# Patient Record
Sex: Female | Born: 1945 | Race: White | Hispanic: No | Marital: Married | Smoking: Never smoker
Health system: Southern US, Community
[De-identification: ages and names within clinical notes are randomized; demographics above are authoritative.]

## PROBLEM LIST (undated history)

## (undated) DIAGNOSIS — M1990 Unspecified osteoarthritis, unspecified site: Secondary | ICD-10-CM

## (undated) DIAGNOSIS — R7303 Prediabetes: Secondary | ICD-10-CM

## (undated) DIAGNOSIS — E119 Type 2 diabetes mellitus without complications: Secondary | ICD-10-CM

## (undated) DIAGNOSIS — I10 Essential (primary) hypertension: Secondary | ICD-10-CM

## (undated) HISTORY — DX: Essential (primary) hypertension: I10

## (undated) HISTORY — DX: Prediabetes: R73.03

## (undated) HISTORY — PX: BREAST BIOPSY: SHX20

## (undated) HISTORY — PX: EYE SURGERY: SHX253

## (undated) HISTORY — DX: Unspecified osteoarthritis, unspecified site: M19.90

## (undated) HISTORY — DX: Type 2 diabetes mellitus without complications: E11.9

---

## 2016-10-02 DIAGNOSIS — I10 Essential (primary) hypertension: Secondary | ICD-10-CM | POA: Insufficient documentation

## 2016-10-02 DIAGNOSIS — E782 Mixed hyperlipidemia: Secondary | ICD-10-CM | POA: Insufficient documentation

## 2017-04-01 HISTORY — PX: WRIST ARTHROSCOPY: SUR100

## 2020-02-25 LAB — HM COLONOSCOPY

## 2020-10-02 LAB — MICROALBUMIN, URINE: Microalb, Ur: 95.8

## 2020-10-02 LAB — MICROALBUMIN / CREATININE URINE RATIO: Microalb Creat Ratio: 505

## 2021-07-04 ENCOUNTER — Ambulatory Visit (INDEPENDENT_AMBULATORY_CARE_PROVIDER_SITE_OTHER): Payer: Medicare Other | Admitting: Internal Medicine

## 2021-07-04 ENCOUNTER — Other Ambulatory Visit: Payer: Self-pay

## 2021-07-04 ENCOUNTER — Encounter: Payer: Self-pay | Admitting: Internal Medicine

## 2021-07-04 VITALS — BP 144/85 | HR 97 | Ht 68.0 in | Wt 162.0 lb

## 2021-07-04 DIAGNOSIS — I10 Essential (primary) hypertension: Secondary | ICD-10-CM

## 2021-07-04 DIAGNOSIS — E118 Type 2 diabetes mellitus with unspecified complications: Secondary | ICD-10-CM | POA: Insufficient documentation

## 2021-07-04 DIAGNOSIS — M19041 Primary osteoarthritis, right hand: Secondary | ICD-10-CM

## 2021-07-04 DIAGNOSIS — Z1159 Encounter for screening for other viral diseases: Secondary | ICD-10-CM | POA: Diagnosis not present

## 2021-07-04 DIAGNOSIS — R7303 Prediabetes: Secondary | ICD-10-CM

## 2021-07-04 DIAGNOSIS — E2839 Other primary ovarian failure: Secondary | ICD-10-CM | POA: Insufficient documentation

## 2021-07-04 DIAGNOSIS — I8393 Asymptomatic varicose veins of bilateral lower extremities: Secondary | ICD-10-CM

## 2021-07-04 DIAGNOSIS — Z1231 Encounter for screening mammogram for malignant neoplasm of breast: Secondary | ICD-10-CM | POA: Diagnosis not present

## 2021-07-04 DIAGNOSIS — M19042 Primary osteoarthritis, left hand: Secondary | ICD-10-CM

## 2021-07-04 MED ORDER — AMLODIPINE BESYLATE 5 MG PO TABS: 5.0000 mg | ORAL_TABLET | Freq: Every day | ORAL | 1 refills | Status: DC

## 2021-07-04 NOTE — Progress Notes (Signed)
° ° °Date:  07/04/2021  ° °Name:  Jenna Sheppard   DOB:  06/01/1946   MRN:  [id number] ° °New patient moved here from [place].  Son lives nearby in [place]. ° °Chief Complaint: New Patient (Initial Visit) and Hypertension ° °Hypertension °This is a chronic problem. The problem is controlled (115/70 at home). Pertinent negatives include no chest pain, headaches, palpitations or shortness of breath. Past treatments include ACE inhibitors and calcium channel blockers. The current treatment provides significant improvement. There is no history of kidney disease, CAD/MI or CVA.  °Diabetes °She presents for her follow-up diabetic visit. Diabetes type: prediabetes. Her disease course has been stable. Pertinent negatives for hypoglycemia include no dizziness, headaches or nervousness/anxiousness. Pertinent negatives for diabetes include no chest pain and no fatigue. Pertinent negatives for diabetic complications include no CVA.  ° °No results found for: NA, K, CO2, GLUCOSE, BUN, CREATININE, CALCIUM, EGFR, GFRNONAA, GLUCOSE °No results found for: CHOL, HDL, LDLCALC, LDLDIRECT, TRIG, CHOLHDL °No results found for: TSH °No results found for: HGBA1C °No results found for: WBC, HGB, HCT, MCV, PLT °No results found for: ALT, AST, GGT, ALKPHOS, BILITOT °No results found for: 25OHVITD2, 25OHVITD3, VD25OH  ° °Review of Systems  °Constitutional:  Negative for chills, fatigue and fever.  °Eyes:  Negative for visual disturbance.  °Respiratory:  Negative for cough, chest tightness and shortness of breath.   °Cardiovascular:  Positive for leg swelling (varicose veins). Negative for chest pain and palpitations.  °Gastrointestinal:  Negative for abdominal pain.  °Genitourinary:  Negative for dysuria and hematuria.  °Musculoskeletal:  Positive for arthralgias and joint swelling. Negative for gait problem.  °Neurological:  Negative for dizziness, light-headedness and headaches.  °Psychiatric/Behavioral:  Negative for dysphoric mood and sleep  disturbance. The patient is not nervous/anxious.   ° °Patient Active Problem List  ° Diagnosis Date Noted  ° 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  ° Ovarian failure 07/04/2021  ° Prediabetes 07/04/2021  ° Essential hypertension 10/02/2016  ° Mixed hyperlipidemia 10/02/2016  ° Osteoarthritis 10/02/2016  ° ° °Allergies  °Allergen Reactions  ° Red Dye Rash  ° Sulfa Antibiotics Nausea Only  ° ° °Past Surgical History:  °Procedure Laterality Date  ° ABDOMINAL HYSTERECTOMY  01/21/2005  ° Still has 1 ovary  ° ABLATION ON ENDOMETRIOSIS  01/08/1990  ° BREAST SURGERY  02/16/1985  ° 2nd surgery - 04/29/1996  ° CHOLECYSTECTOMY  07/21/2000  ° DILATION AND CURETTAGE OF UTERUS  02/18/1989  ° laser eye  02/09/2010  ° WRIST ARTHROSCOPY Left 04/01/2017  ° ° °Social History  ° °Tobacco Use  ° Smoking status: Never  ° Smokeless tobacco: Never  °Substance Use Topics  ° Alcohol use: Yes  °  Comment: RARE  ° Drug use: Never  ° ° ° °Medication list has been reviewed and updated. ° °Current Meds  °Medication Sig  ° co-enzyme Q-10 30 MG capsule Take 30 mg by mouth 3 (three) times daily.  ° diclofenac (VOLTAREN) 75 MG EC tablet Take 75 mg by mouth as needed.  ° Krill Oil 1000 MG CAPS Take by mouth.  ° lisinopril (ZESTRIL) 40 MG tablet Take 40 mg by mouth daily.  ° Multiple Vitamins-Minerals (MULTIVITAMIN WITH MINERALS) tablet Take 1 tablet by mouth daily.  ° [DISCONTINUED] amLODipine (NORVASC) 5 MG tablet Take 5-10 mg by mouth daily.  ° [DISCONTINUED] estradiol (ESTRING) 2 MG vaginal ring Place 2 mg vaginally. 3 times weekly.  ° ° °PHQ 2/9 Scores 07/04/2021  °PHQ - 2 Score 0  °  PHQ- 9 Score 0    GAD 7 : Generalized Anxiety Score 07/04/2021  Nervous, Anxious, on Edge 1  Control/stop worrying 1  Worry too much - different things 1  Trouble relaxing 0  Restless 0  Easily annoyed or irritable 3  Afraid - awful might happen 0  Total GAD 7 Score 6    BP Readings from Last 3 Encounters:  07/04/21 (!) 144/85    Physical  Exam Vitals and nursing note reviewed.  Constitutional:      General: She is not in acute distress.    Appearance: She is well-developed.  HENT:     Head: Normocephalic and atraumatic.  Neck:     Vascular: No carotid bruit.  Cardiovascular:     Rate and Rhythm: Normal rate and regular rhythm.     Pulses: Normal pulses.     Heart sounds: Normal heart sounds.  Pulmonary:     Effort: Pulmonary effort is normal. No respiratory distress.     Breath sounds: Normal breath sounds. No wheezing or rhonchi.  Genitourinary:   Musculoskeletal:     Cervical back: Normal range of motion.  Lymphadenopathy:     Cervical: No cervical adenopathy.  Skin:    General: Skin is warm and dry.     Findings: No rash.  Neurological:     Mental Status: She is alert and oriented to person, place, and time.  Psychiatric:        Attention and Perception: Attention normal.        Mood and Affect: Mood normal.        Behavior: Behavior normal.    Wt Readings from Last 3 Encounters:  07/04/21 162 lb (73.5 kg)    BP (!) 144/85 (BP Location: Right Arm, Cuff Size: Normal)    Pulse 97    Ht 5' 8" (1.727 m)    Wt 162 lb (73.5 kg)    SpO2 98%    BMI 24.63 kg/m   Assessment and Plan: 1. Essential hypertension Clinically stable exam with well controlled BP at home. Continue amlodipine 5 mg and lisinopril 40 mg. She occasionally takes an extra amlodipine at bedtime if BP is high. Tolerating medications without side effects at this time. Pt to continue current regimen and low sodium diet; benefits of regular exercise as able discussed. - amLODipine (NORVASC) 5 MG tablet; Take 1 tablet (5 mg total) by mouth daily.  Dispense: 90 tablet; Refill: 1 - CBC with Differential/Platelet - Comprehensive metabolic panel  2. Prediabetes Continue healthy diet and exercise - Hemoglobin A1c  3. Encounter for screening mammogram for breast cancer Will try to order but is currently blocked by the system.  4. Need for  hepatitis C screening test - Hepatitis C antibody  5. Ovarian failure Schedule at [facility] - DG Bone Density  6. Osteoarthritis of fingers of both hands Continue nsaids as needed   Partially dictated using Editor, commissioning. Any errors are unintentional.  Halina Maidens, MD [facility]  07/04/2021

## 2021-07-05 ENCOUNTER — Other Ambulatory Visit: Payer: Self-pay | Admitting: Internal Medicine

## 2021-07-05 DIAGNOSIS — Z1231 Encounter for screening mammogram for malignant neoplasm of breast: Secondary | ICD-10-CM

## 2021-07-06 ENCOUNTER — Other Ambulatory Visit: Payer: Self-pay | Admitting: Internal Medicine

## 2021-07-06 ENCOUNTER — Telehealth: Payer: Self-pay | Admitting: Internal Medicine

## 2021-07-06 DIAGNOSIS — Z1231 Encounter for screening mammogram for malignant neoplasm of breast: Secondary | ICD-10-CM

## 2021-07-06 NOTE — Telephone Encounter (Signed)
Informed pt that mammogram was ordered. Pt verbalized understanding. Pt stated she has signed release forms.  KP

## 2021-07-06 NOTE — Telephone Encounter (Signed)
Call pt re: mammogram

## 2021-07-09 LAB — CBC WITH DIFFERENTIAL/PLATELET

## 2021-07-09 LAB — COMPREHENSIVE METABOLIC PANEL
ALT: 19 IU/L (ref 0–32)
AST: 19 IU/L (ref 0–40)
Albumin/Globulin Ratio: 2.4 — ABNORMAL HIGH (ref 1.2–2.2)
Albumin: 4.8 g/dL — ABNORMAL HIGH (ref 3.7–4.7)
Alkaline Phosphatase: 78 IU/L (ref 44–121)
BUN/Creatinine Ratio: 21 (ref 12–28)
BUN: 18 mg/dL (ref 8–27)
Bilirubin Total: 0.5 mg/dL (ref 0.0–1.2)
CO2: 25 mmol/L (ref 20–29)
Calcium: 9.2 mg/dL (ref 8.7–10.3)
Chloride: 105 mmol/L (ref 96–106)
Creatinine, Ser: 0.86 mg/dL (ref 0.57–1.00)
Globulin, Total: 2 g/dL (ref 1.5–4.5)
Glucose: 121 mg/dL — ABNORMAL HIGH (ref 70–99)
Potassium: 4.2 mmol/L (ref 3.5–5.2)
Sodium: 145 mmol/L — ABNORMAL HIGH (ref 134–144)
Total Protein: 6.8 g/dL (ref 6.0–8.5)
eGFR: 70 mL/min/{1.73_m2} (ref >59)

## 2021-07-09 LAB — HEPATITIS C ANTIBODY: Hep C Virus Ab: <0.1 s/co ratio (ref 0.0–0.9)

## 2021-07-09 LAB — HEMOGLOBIN A1C

## 2021-07-19 ENCOUNTER — Other Ambulatory Visit: Payer: Self-pay

## 2021-07-19 DIAGNOSIS — R7303 Prediabetes: Secondary | ICD-10-CM

## 2021-07-19 DIAGNOSIS — I10 Essential (primary) hypertension: Secondary | ICD-10-CM

## 2021-07-19 DIAGNOSIS — E782 Mixed hyperlipidemia: Secondary | ICD-10-CM

## 2021-07-19 NOTE — Progress Notes (Signed)
orders

## 2021-07-20 ENCOUNTER — Other Ambulatory Visit: Payer: Self-pay | Admitting: Internal Medicine

## 2021-07-20 LAB — CBC WITH DIFFERENTIAL
Basophils Absolute: 0.1 10*3/uL (ref 0.0–0.2)
Basos: 1 %
EOS (ABSOLUTE): 0.2 10*3/uL (ref 0.0–0.4)
Eos: 3 %
Hematocrit: 37.7 % (ref 34.0–46.6)
Hemoglobin: 12.6 g/dL (ref 11.1–15.9)
Immature Grans (Abs): 0 10*3/uL (ref 0.0–0.1)
Immature Granulocytes: 0 %
Lymphocytes Absolute: 1.7 10*3/uL (ref 0.7–3.1)
Lymphs: 35 %
MCH: 31.2 pg (ref 26.6–33.0)
MCHC: 33.4 g/dL (ref 31.5–35.7)
MCV: 93 fL (ref 79–97)
Monocytes Absolute: 0.4 10*3/uL (ref 0.1–0.9)
Monocytes: 9 %
Neutrophils Absolute: 2.5 10*3/uL (ref 1.4–7.0)
Neutrophils: 52 %
RBC: 4.04 x10E6/uL (ref 3.77–5.28)
RDW: 13 % (ref 11.7–15.4)
WBC: 4.9 10*3/uL (ref 3.4–10.8)

## 2021-07-20 LAB — HEMOGLOBIN A1C
Est. average glucose Bld gHb Est-mCnc: 148 mg/dL
Hgb A1c MFr Bld: 6.8 % — ABNORMAL HIGH (ref 4.8–5.6)

## 2021-07-26 ENCOUNTER — Telehealth: Payer: Self-pay

## 2021-07-26 NOTE — Telephone Encounter (Signed)
Called pt as a reminder to call and schedule a mammogram. Pt verbalized understanding.  KP

## 2021-08-07 ENCOUNTER — Ambulatory Visit
Admission: RE | Admit: 2021-08-07 | Discharge: 2021-08-07 | Disposition: A | Discharge status: Home / Self Care | Payer: Medicare Other | Source: Ambulatory Visit | Attending: Internal Medicine | Admitting: Internal Medicine

## 2021-08-07 ENCOUNTER — Other Ambulatory Visit: Payer: Self-pay

## 2021-08-07 DIAGNOSIS — Z1231 Encounter for screening mammogram for malignant neoplasm of breast: Secondary | ICD-10-CM | POA: Insufficient documentation

## 2021-08-16 ENCOUNTER — Telehealth: Payer: Self-pay

## 2021-08-16 NOTE — Telephone Encounter (Signed)
Called pt as a reminder to call and schedule bone density. Pt verbalized understanding. ? ?KP ?

## 2021-09-04 ENCOUNTER — Other Ambulatory Visit: Payer: Self-pay

## 2021-09-04 ENCOUNTER — Other Ambulatory Visit: Payer: Self-pay | Admitting: Internal Medicine

## 2021-09-04 ENCOUNTER — Ambulatory Visit
Admission: RE | Admit: 2021-09-04 | Discharge: 2021-09-04 | Disposition: A | Discharge status: Home / Self Care | Payer: Medicare Other | Source: Ambulatory Visit | Attending: Internal Medicine | Admitting: Internal Medicine

## 2021-09-04 ENCOUNTER — Encounter: Payer: Self-pay | Admitting: Internal Medicine

## 2021-09-04 DIAGNOSIS — E2839 Other primary ovarian failure: Secondary | ICD-10-CM | POA: Insufficient documentation

## 2021-09-04 DIAGNOSIS — M810 Age-related osteoporosis without current pathological fracture: Secondary | ICD-10-CM | POA: Insufficient documentation

## 2021-09-04 MED ORDER — ALENDRONATE SODIUM 70 MG PO TABS: 70.0000 mg | ORAL_TABLET | ORAL | 3 refills | Status: DC

## 2021-11-08 ENCOUNTER — Other Ambulatory Visit: Payer: Self-pay | Admitting: Internal Medicine

## 2021-11-08 NOTE — Telephone Encounter (Unsigned)
Copied from [place] [id number]. Topic: General - Other >> Nov 08, 2021  3:05 PM Tessa Lerner A wrote: Reason for CRM: Medication Refill - Medication: Lancets (ONETOUCH DELICA PLUS [id number]) Lane [[id number]]   glucose blood test strip [[id number]]   Has the patient contacted their pharmacy? Yes. The patient was directed to contact their PCP (Agent: If no, request that the patient contact the pharmacy for the refill. If patient does not wish to contact the pharmacy document the reason why and proceed with request.) (Agent: If yes, when and what did the pharmacy advise?)  Preferred Pharmacy (with phone number or street name): [facility] [place] [address] Phone: [phone] Fax: [phone] Hours: Not open 24 hours   Has the patient been seen for an appointment in the last year OR does the patient have an upcoming appointment? Yes.    Agent: Please be advised that RX refills may take up to 3 business days. We ask that you follow-up with your pharmacy.

## 2021-11-09 ENCOUNTER — Other Ambulatory Visit: Payer: Self-pay | Admitting: Internal Medicine

## 2021-11-09 MED ORDER — ONETOUCH DELICA PLUS LANCET33G MISC: 0 refills | Status: DC

## 2021-11-09 NOTE — Telephone Encounter (Signed)
Requested medication (s) are due for refill today: yes  Requested medication (s) are on the active medication list: yes  Last refill:    Future visit scheduled: yes  Notes to clinic:  historical. Please advise     Requested Prescriptions  Pending Prescriptions Disp Refills   Lancets (ONETOUCH DELICA PLUS [id number]) [place] 100 each 0    Sig: OneTouch Delica Plus Lancet 33 gauge  USE ONE LANCET ONE TIME DAILY FOR CHECKING OF BLOOD GLUCOSE     Endocrinology: Diabetes - Testing Supplies Passed - 11/09/2021  8:45 AM      Passed - Valid encounter within last 12 months    Recent Outpatient Visits           4 months ago Essential hypertension   [facility] Glean Hess, MD       Future Appointments             In 1 week Army Melia Jesse Sans, MD [facility], [place]   In 1 month Army Melia, Jesse Sans, MD [place]

## 2021-11-22 ENCOUNTER — Encounter: Payer: Self-pay | Admitting: Internal Medicine

## 2021-11-22 ENCOUNTER — Telehealth: Payer: Self-pay

## 2021-11-22 ENCOUNTER — Ambulatory Visit (INDEPENDENT_AMBULATORY_CARE_PROVIDER_SITE_OTHER): Payer: Medicare Other | Admitting: Internal Medicine

## 2021-11-22 VITALS — BP 126/84 | HR 67 | Ht 68.0 in | Wt 153.6 lb

## 2021-11-22 DIAGNOSIS — I10 Essential (primary) hypertension: Secondary | ICD-10-CM | POA: Diagnosis not present

## 2021-11-22 DIAGNOSIS — R7303 Prediabetes: Secondary | ICD-10-CM

## 2021-11-22 DIAGNOSIS — D485 Neoplasm of uncertain behavior of skin: Secondary | ICD-10-CM | POA: Insufficient documentation

## 2021-11-22 LAB — POCT GLYCOSYLATED HEMOGLOBIN (HGB A1C): Hemoglobin A1C: 6.3 % — AB (ref 4.0–5.6)

## 2021-11-22 MED ORDER — ONETOUCH DELICA PLUS LANCET33G MISC: 12 refills | Status: AC

## 2021-11-22 MED ORDER — GLUCOSE BLOOD VI STRP: ORAL_STRIP | 12 refills | Status: AC

## 2021-11-22 NOTE — Telephone Encounter (Signed)
Called and left patient VM being sure she was informed about her Bone Density report from March. She has osteoporosis and needs to be taking vit d and calcium daily along with her fosamax.  PEC may give results if pt returns call.   - Terin Cragle

## 2021-11-22 NOTE — Progress Notes (Signed)
Date:  11/22/2021   Name:  Jenna Sheppard   DOB:  1945/09/27   MRN:  [id number]   Chief Complaint: Diabetes  Diabetes She presents for her follow-up diabetic visit. She has type 2 (prediabetes with marginally elevated A1C last visit) diabetes mellitus. Her disease course has been stable. Pertinent negatives for hypoglycemia include no headaches or tremors. There are no diabetic associated symptoms. Pertinent negatives for diabetes include no chest pain, no fatigue, no polydipsia and no polyuria. Symptoms are improving. Pertinent negatives for diabetic complications include no CVA. Current diabetic treatment includes diet. Her weight is decreasing steadily (has lost 9 lbs). She is following a diabetic diet. Her breakfast blood glucose is taken between 6-7 am. Her breakfast blood glucose range is generally 110-130 mg/dl. An ACE inhibitor/angiotensin II receptor blocker is being taken.  Hypertension This is a chronic problem. The problem is controlled. Pertinent negatives include no chest pain, headaches, palpitations or shortness of breath. Past treatments include ACE inhibitors and calcium channel blockers. There is no history of kidney disease, CAD/MI or CVA.    Lab Results  Component Value Date   NA 145 (H) 07/04/2021   K 4.2 07/04/2021   CO2 25 07/04/2021   GLUCOSE 121 (H) 07/04/2021   BUN 18 07/04/2021   CREATININE 0.86 07/04/2021   CALCIUM 9.2 07/04/2021   EGFR 70 07/04/2021   No results found for: "CHOL", "HDL", "LDLCALC", "LDLDIRECT", "TRIG", "CHOLHDL" No results found for: "TSH" Lab Results  Component Value Date   HGBA1C 6.3 (A) 11/22/2021   Lab Results  Component Value Date   WBC 4.9 07/19/2021   HGB 12.6 07/19/2021   HCT 37.7 07/19/2021   MCV 93 07/19/2021   PLT CANCELED 07/04/2021   Lab Results  Component Value Date   ALT 19 07/04/2021   AST 19 07/04/2021   ALKPHOS 78 07/04/2021   BILITOT 0.5 07/04/2021   No results found for: "25OHVITD2", "25OHVITD3",  "VD25OH"   Review of Systems  Constitutional:  Positive for unexpected weight change (has lost 9 lbs with effort). Negative for appetite change, fatigue and fever.  HENT:  Negative for tinnitus and trouble swallowing.   Eyes:  Negative for visual disturbance.  Respiratory:  Negative for cough, chest tightness and shortness of breath.   Cardiovascular:  Negative for chest pain, palpitations and leg swelling.  Gastrointestinal:  Negative for abdominal pain.  Endocrine: Negative for polydipsia and polyuria.  Genitourinary:  Negative for dysuria.  Musculoskeletal:  Negative for arthralgias.  Neurological:  Negative for tremors, numbness and headaches.  Psychiatric/Behavioral:  Negative for dysphoric mood.     Patient Active Problem List   Diagnosis Date Noted   Age-related osteoporosis without current pathological fracture 09/04/2021   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   Ovarian failure 07/04/2021   Prediabetes 07/04/2021   Varicose veins of both lower extremities 07/04/2021   Essential hypertension 10/02/2016   Mixed hyperlipidemia 10/02/2016    Allergies  Allergen Reactions   Red Dye Rash   Sulfa Antibiotics Nausea Only    Past Surgical History:  Procedure Laterality Date   ABDOMINAL HYSTERECTOMY  01/21/2005   Still has 1 ovary   ABLATION ON ENDOMETRIOSIS  01/08/1990   BREAST BIOPSY Right    benign, 20 years ago   BREAST SURGERY  02/16/1985   2nd surgery - 04/29/1996   CHOLECYSTECTOMY  07/21/2000   DILATION AND CURETTAGE OF UTERUS  02/18/1989   laser eye  02/09/2010   WRIST ARTHROSCOPY Left 04/01/2017  Social History   Tobacco Use   Smoking status: Never   Smokeless tobacco: Never  Substance Use Topics   Alcohol use: Yes    Comment: RARE   Drug use: Never     Medication list has been reviewed and updated.  Current Meds  Medication Sig   alendronate (FOSAMAX) 70 MG tablet Take 1 tablet (70 mg total) by mouth every 7 (seven) days. Take  with a full glass of water on an empty stomach.   amLODipine (NORVASC) 5 MG tablet Take 1 tablet (5 mg total) by mouth daily.   co-enzyme Q-10 30 MG capsule Take 30 mg by mouth 3 (three) times daily.   diclofenac (VOLTAREN) 75 MG EC tablet Take 75 mg by mouth as needed.   estradiol (ESTRACE) 0.1 MG/GM vaginal cream estradiol 0.01% (0.1 mg/gram) vaginal cream  APPLY AS DIRECTED BY TOPICAL ROUTE EVERY OTHER DAY THREE TIMES A WEEK USING FINGERTIP   Krill Oil 1000 MG CAPS Take by mouth.   lisinopril (ZESTRIL) 40 MG tablet Take 40 mg by mouth daily.   Multiple Vitamins-Minerals (MULTIVITAMIN WITH MINERALS) tablet Take 1 tablet by mouth daily.   [DISCONTINUED] glucose blood test strip OneTouch Ultra Test strips  TEST ONCE DAILY   [DISCONTINUED] Lancets (ONETOUCH DELICA PLUS [id number]) MISC OneTouch Delica Plus Lancet 33 gauge  USE ONE LANCET ONE TIME DAILY FOR CHECKING OF BLOOD GLUCOSE       07/04/2021    2:52 PM  GAD 7 : Generalized Anxiety Score  Nervous, Anxious, on Edge 1  Control/stop worrying 1  Worry too much - different things 1  Trouble relaxing 0  Restless 0  Easily annoyed or irritable 3  Afraid - awful might happen 0  Total GAD 7 Score 6       07/04/2021    2:52 PM  Depression screen PHQ 2/9  Decreased Interest 0  Down, Depressed, Hopeless 0  PHQ - 2 Score 0  Altered sleeping 0  Tired, decreased energy 0  Change in appetite 0  Feeling bad or failure about yourself  0  Trouble concentrating 0  Moving slowly or fidgety/restless 0  Suicidal thoughts 0  PHQ-9 Score 0  Difficult doing work/chores Not difficult at all    BP Readings from Last 3 Encounters:  11/22/21 126/84  07/04/21 (!) 144/85    Physical Exam Vitals and nursing note reviewed.  Constitutional:      General: She is not in acute distress.    Appearance: Normal appearance. She is well-developed.  HENT:     Head: Normocephalic and atraumatic.  Neck:     Vascular: No carotid bruit.   Cardiovascular:     Rate and Rhythm: Normal rate and regular rhythm.     Pulses: Normal pulses.     Heart sounds: No murmur heard. Pulmonary:     Effort: Pulmonary effort is normal. No respiratory distress.     Breath sounds: No wheezing or rhonchi.  Musculoskeletal:     Cervical back: Normal range of motion.     Right lower leg: No edema.     Left lower leg: No edema.  Lymphadenopathy:     Cervical: No cervical adenopathy.  Skin:    General: Skin is warm and dry.     Findings: No rash.          Comments: Flat smooth brown macule (new)  Neurological:     Mental Status: She is alert and oriented to person, place, and time.  Psychiatric:  Mood and Affect: Mood normal.        Behavior: Behavior normal.     Wt Readings from Last 3 Encounters:  11/22/21 153 lb 9.6 oz (69.7 kg)  07/04/21 162 lb (73.5 kg)    BP 126/84   Pulse 67   Ht $R'5\' 8"'gu$  (1.727 m)   Wt 153 lb 9.6 oz (69.7 kg)   SpO2 98%   BMI 23.35 kg/m   Assessment and Plan: 1. Prediabetes Last A1C was over 6.5 but she has made significant improvement with diet and weight loss Continue low carb diet; reduce FSBS to several times per week and PRN - POCT HgB A1C = 6.3 down from 6.8  2. Essential hypertension Clinically stable exam with well controlled BP. Tolerating medications without side effects at this time. Pt to continue current regimen and low sodium diet; benefits of regular exercise as able discussed.  3. Neoplasm of uncertain behavior of skin Recommend establishing with Dermatology. - Ambulatory referral to Dermatology   Partially dictated using Dragon software. Any errors are unintentional.  Halina Maidens, MD [facility]  11/22/2021

## 2021-11-28 ENCOUNTER — Ambulatory Visit (INDEPENDENT_AMBULATORY_CARE_PROVIDER_SITE_OTHER): Payer: Medicare Other

## 2021-11-28 DIAGNOSIS — Z0100 Encounter for examination of eyes and vision without abnormal findings: Secondary | ICD-10-CM

## 2021-11-28 DIAGNOSIS — Z0000 Encounter for general adult medical examination without abnormal findings: Secondary | ICD-10-CM | POA: Diagnosis not present

## 2021-11-28 NOTE — Progress Notes (Signed)
Subjective:   Jenna Sheppard is a 76 y.o. female who presents for an Initial Medicare Annual Wellness Visit.  Virtual Visit via Telephone Note  I connected with  Jenna Sheppard on 11/28/21 at  8:15 AM EDT by telephone and verified that I am speaking with the correct person using two identifiers.  Location: Patient: home Provider: [facility] Persons participating in the virtual visit: [place]   I discussed the limitations, risks, security and privacy concerns of performing an evaluation and management service by telephone and the availability of in person appointments. The patient expressed understanding and agreed to proceed.  Interactive audio and video telecommunications were attempted between this nurse and patient, however failed, due to patient having technical difficulties OR patient did not have access to video capability.  We continued and completed visit with audio only.  Some vital signs may be absent or patient reported.   Clemetine Marker, LPN   Review of Systems     Cardiac Risk Factors include: advanced age (>75mn, >>54women);diabetes mellitus;hypertension     Objective:    There were no vitals filed for this visit. There is no height or weight on file to calculate BMI.     11/28/2021    8:25 AM  Advanced Directives  Does Patient Have a Medical Advance Directive? Yes  Type of [place]  [place] of [place]? No - copy requested    Current Medications (verified) Outpatient Encounter Medications as of 11/28/2021  Medication Sig   alendronate (FOSAMAX) 70 MG tablet Take 1 tablet (70 mg total) by mouth every 7 (seven) days. Take with a full glass of water on an empty stomach.   amLODipine (NORVASC) 5 MG tablet Take 1 tablet (5 mg total) by mouth daily.   co-enzyme Q-10 30 MG capsule Take 30 mg by mouth 3 (three) times daily.   diclofenac Sodium (VOLTAREN) 1 % GEL Apply topically  4 (four) times daily. PRN   estradiol (ESTRACE) 0.1 MG/GM vaginal cream estradiol 0.01% (0.1 mg/gram) vaginal cream  APPLY AS DIRECTED BY TOPICAL ROUTE EVERY OTHER DAY THREE TIMES A WEEK USING FINGERTIP   glucose blood test strip OneTouch Ultra Test strips  TEST ONCE DAILY   Krill Oil 1000 MG CAPS Take by mouth.   Lancets (ONETOUCH DELICA PLUS LFOYDXA12I MISC OneTouch Delica Plus Lancet 33 gauge  USE ONE LANCET ONE TIME DAILY FOR CHECKING OF BLOOD GLUCOSE   lisinopril (ZESTRIL) 40 MG tablet Take 40 mg by mouth daily.   Multiple Vitamins-Minerals (MULTIVITAMIN WITH MINERALS) tablet Take 1 tablet by mouth daily.   [DISCONTINUED] diclofenac (VOLTAREN) 75 MG EC tablet Take 75 mg by mouth as needed.   No facility-administered encounter medications on file as of 11/28/2021.    Allergies (verified) Red dye and Sulfa antibiotics   History: Past Medical History:  Diagnosis Date   Hypertension    Osteoarthritis    Prediabetes    Past Surgical History:  Procedure Laterality Date   ABDOMINAL HYSTERECTOMY  01/21/2005   Still has 1 ovary   ABLATION ON ENDOMETRIOSIS  01/08/1990   BREAST BIOPSY Right    benign, 20 years ago   BREAST SURGERY  02/16/1985   2nd surgery - 04/29/1996   CHOLECYSTECTOMY  07/21/2000   DILATION AND CURETTAGE OF UTERUS  02/18/1989   laser eye  02/09/2010   WRIST ARTHROSCOPY Left 04/01/2017   Family History  Problem Relation Age of Onset   Stroke Mother  Stroke Father    Breast cancer Sister    Breast cancer Paternal Grandmother 6   Parkinson's disease Brother    Social History   Socioeconomic History   Marital status: Married    Spouse name: Jenna Sheppard   Number of children: 1   Years of education: Not on file   Highest education level: Not on file  Occupational History   Not on file  Tobacco Use   Smoking status: Never   Smokeless tobacco: Never  Vaping Use   Vaping Use: Never used  Substance and Sexual Activity   Alcohol use: Yes    Comment: RARE    Drug use: Never   Sexual activity: Not Currently    Birth control/protection: None  Other Topics Concern   Not on file  Social History Narrative   Not on file   Social Determinants of Health   Financial Resource Strain: Low Risk  (11/28/2021)   Overall Financial Resource Strain (CARDIA)    Difficulty of Paying Living Expenses: Not hard at all  Food Insecurity: No Food Insecurity (11/28/2021)   Hunger Vital Sign    Worried About Running Out of Food in the Last Year: Never true    Ran Out of Food in the Last Year: Never true  Transportation Needs: No Transportation Needs (11/28/2021)   PRAPARE - Hydrologist (Medical): No    Lack of Transportation (Non-Medical): No  Physical Activity: Sufficiently Active (11/28/2021)   Exercise Vital Sign    Days of Exercise per Week: 5 days    Minutes of Exercise per Session: 50 min  Stress: No Stress Concern Present (11/28/2021)   [place]    Feeling of Stress : Not at all  Social Connections: Moderately Isolated (11/28/2021)   Social Connection and Isolation Panel [NHANES]    Frequency of Communication with Friends and Family: More than three times a week    Frequency of Social Gatherings with Friends and Family: More than three times a week    Attends Religious Services: Never    Marine scientist or Organizations: No    Attends Music therapist: Never    Marital Status: Married    Tobacco Counseling Counseling given: Not Answered   Clinical Intake:  Pre-visit preparation completed: Yes  Pain : No/denies pain     Nutritional Risks: None Diabetes: Yes CBG done?: No Did pt. bring in CBG monitor from home?: No Pt is prediabetic; monitors blood sugar 3 times per week   How often do you need to have someone help you when you read instructions, pamphlets, or other written materials from your doctor or pharmacy?: 1 -  Never  Interpreter Needed?: No  Information entered by :: Clemetine Marker LPN   Activities of Daily Living    11/28/2021    8:27 AM 07/04/2021    2:53 PM  In your present state of health, do you have any difficulty performing the following activities:  Hearing? 0 0  Vision? 0 0  Difficulty concentrating or making decisions? 0 0  Walking or climbing stairs? 0 0  Dressing or bathing? 0 0  Doing errands, shopping? 0 0  Preparing Food and eating ? N   Using the Toilet? N   In the past six months, have you accidently leaked urine? N   Do you have problems with loss of bowel control? N   Managing your Medications? N   Managing your  Finances? N   Housekeeping or managing your Housekeeping? N     Patient Care Team: Glean Hess, MD as PCP - General (Internal Medicine)  Indicate any recent Medical Services you may have received from other than [facility] providers in the past year (date may be approximate).     Assessment:   This is a routine wellness examination for Leonie.  Hearing/Vision screen Hearing Screening - Comments:: Pt denies hearing difficulty Vision Screening - Comments:: Due for eye exam; needs to establish care with provider in area. Referral sent today  Dietary issues and exercise activities discussed: Current Exercise Habits: Home exercise routine, Type of exercise: walking, Time (Minutes): 45, Frequency (Times/Week): 5, Weekly Exercise (Minutes/Week): 225, Intensity: Moderate, Exercise limited by: None identified   Goals Addressed             This Visit's Progress    Patient Stated       Pt states she would like to remain healthy and active, add weight bearing exercises       Depression Screen    11/28/2021    8:24 AM 07/04/2021    2:52 PM  PHQ 2/9 Scores  PHQ - 2 Score 0 0  PHQ- 9 Score  0    Fall Risk    11/28/2021    8:26 AM 07/04/2021    2:53 PM  [place] in the past year? 0 1  Number falls in past yr: 0 0  Injury with Fall? 0 0   Risk for fall due to : No Fall Risks History of fall(s)  Follow up Falls prevention discussed Falls evaluation completed    [place]:  Any stairs in or around the home? Yes  If so, are there any without handrails? No  Home free of loose throw rugs in walkways, pet beds, electrical cords, etc? Yes  Adequate lighting in your home to reduce risk of falls? Yes   ASSISTIVE DEVICES UTILIZED TO PREVENT FALLS:  Life alert? No  Use of a cane, walker or w/c? No  Grab bars in the bathroom? No  Shower chair or bench in shower? No  Elevated toilet seat or a handicapped toilet? No   TIMED UP AND GO:  Was the test performed? No . Telephonic visit.   Cognitive Function: Normal cognitive status assessed by direct observation by this Nurse Health Advisor. No abnormalities found.          Immunizations Immunization History  Administered Date(s) Administered   Influenza-Unspecified 03/08/2021   Moderna Covid-19 Vaccine Bivalent Booster 44yr & up 10/16/2021   PFIZER Comirnaty(Gray Top)Covid-19 Tri-Sucrose Vaccine 07/10/2019, 07/31/2019, 09/12/2020, 02/23/2021   Pneumococcal Conjugate-13 04/04/2016   Pneumococcal Polysaccharide-23 06/11/2007, 10/04/2015   Tdap 11/01/2021   Tetanus Immune Globulin 04/04/2016   Zoster Recombinat (Shingrix) 11/19/2016, 01/18/2017    TDAP status: Up to date  Flu Vaccine status: Up to date  Pneumococcal vaccine status: Up to date  Covid-19 vaccine status: Completed vaccines  Qualifies for Shingles Vaccine? Yes   Zostavax completed No   Shingrix Completed?: Yes  Screening Tests Health Maintenance  Topic Date Due   INFLUENZA VACCINE  01/08/2022   COLONOSCOPY (Pts 45-41yrInsurance coverage will need to be confirmed)  02/25/2023   TETANUS/TDAP  11/02/2031   Pneumonia Vaccine 6532Years old  Completed   DEXA SCAN  Completed   COVID-19 Vaccine  Completed   Hepatitis C Screening  Completed   Zoster Vaccines-  Shingrix  Completed  HPV VACCINES  Aged Out    Health Maintenance  There are no preventive care reminders to display for this patient.  Colorectal cancer screening: Type of screening: Colonoscopy. Completed 02/25/20. Repeat every 3 years  Mammogram status: Completed 08/07/21. Repeat every year  Bone Density status: Completed 09/04/21. Results reflect: Bone density results: OSTEOPOROSIS. Repeat every 2 years.  Lung Cancer Screening: (Low Dose CT Chest recommended if Age 64-80 years, 30 pack-year currently smoking OR have quit w/in 15years.) does not qualify.   Additional Screening:  Hepatitis C Screening: does qualify; Completed 07/04/21  Vision Screening: Recommended annual ophthalmology exams for early detection of glaucoma and other disorders of the eye. Is the patient up to date with their annual eye exam?  No  Who is the provider or what is the name of the office in which the patient attends annual eye exams? Not established If pt is not established with a provider, would they like to be referred to a provider to establish care? Yes .   Dental Screening: Recommended annual dental exams for proper oral hygiene  Community Resource Referral / Chronic Care Management: CRR required this visit?  No   CCM required this visit?  No      Plan:     I have personally reviewed and noted the following in the patient's chart:   Medical and social history Use of alcohol, tobacco or illicit drugs  Current medications and supplements including opioid prescriptions. Patient is not currently taking opioid prescriptions. Functional ability and status Nutritional status Physical activity Advanced directives List of other physicians Hospitalizations, surgeries, and ER visits in previous 12 months Vitals Screenings to include cognitive, depression, and falls Referrals and appointments  In addition, I have reviewed and discussed with patient certain preventive protocols, quality metrics,  and best practice recommendations. A written personalized care plan for preventive services as well as general preventive health recommendations were provided to patient.     Clemetine Marker, LPN   [id number]   Nurse Notes: none

## 2021-11-28 NOTE — Patient Instructions (Signed)
Jenna Sheppard , Thank you for taking time to come for your Medicare Wellness Visit. I appreciate your ongoing commitment to your health goals. Please review the following plan we discussed and let me know if I can assist you in the future.   Screening recommendations/referrals: Colonoscopy: done 02/25/20. Repeat 02/2023 Mammogram: done 08/07/21 Bone Density: done 09/04/21 Recommended yearly ophthalmology/optometry visit for glaucoma screening and checkup Recommended yearly dental visit for hygiene and checkup  Vaccinations: Influenza vaccine: done 03/08/21 Pneumococcal vaccine: done 04/04/16 Tdap vaccine: done 11/01/21 Shingles vaccine: done 11/19/16 & 01/18/17   Covid-19:done 07/10/19, 07/31/19, 09/12/20, 02/23/21 & 10/16/21  Advanced directives: Please bring a copy of your health care power of attorney and living will to the office at your convenience.   Conditions/risks identified: Keep up the great work!  Next appointment: Follow up in one year for your annual wellness visit    Preventive Care 65 Years and Older, Female Preventive care refers to lifestyle choices and visits with your health care provider that can promote health and wellness. What does preventive care include? A yearly physical exam. This is also called an annual well check. Dental exams once or twice a year. Routine eye exams. Ask your health care provider how often you should have your eyes checked. Personal lifestyle choices, including: Daily care of your teeth and gums. Regular physical activity. Eating a healthy diet. Avoiding tobacco and drug use. Limiting alcohol use. Practicing safe sex. Taking low-dose aspirin every day. Taking vitamin and mineral supplements as recommended by your health care provider. What happens during an annual well check? The services and screenings done by your health care provider during your annual well check will depend on your age, overall health, lifestyle risk factors, and family history  of disease. Counseling  Your health care provider may ask you questions about your: Alcohol use. Tobacco use. Drug use. Emotional well-being. Home and relationship well-being. Sexual activity. Eating habits. History of falls. Memory and ability to understand (cognition). Work and work Statistician. Reproductive health. Screening  You may have the following tests or measurements: Height, weight, and BMI. Blood pressure. Lipid and cholesterol levels. These may be checked every 5 years, or more frequently if you are over 53 years old. Skin check. Lung cancer screening. You may have this screening every year starting at age 94 if you have a 30-pack-year history of smoking and currently smoke or have quit within the past 15 years. Fecal occult blood test (FOBT) of the stool. You may have this test every year starting at age 66. Flexible sigmoidoscopy or colonoscopy. You may have a sigmoidoscopy every 5 years or a colonoscopy every 10 years starting at age 22. Hepatitis C blood test. Hepatitis B blood test. Sexually transmitted disease (STD) testing. Diabetes screening. This is done by checking your blood sugar (glucose) after you have not eaten for a while (fasting). You may have this done every 1-3 years. Bone density scan. This is done to screen for osteoporosis. You may have this done starting at age 11. Mammogram. This may be done every 1-2 years. Talk to your health care provider about how often you should have regular mammograms. Talk with your health care provider about your test results, treatment options, and if necessary, the need for more tests. Vaccines  Your health care provider may recommend certain vaccines, such as: Influenza vaccine. This is recommended every year. Tetanus, diphtheria, and acellular pertussis (Tdap, Td) vaccine. You may need a Td booster every 10 years. Zoster vaccine. You  may need this after age 7. Pneumococcal 13-valent conjugate (PCV13) vaccine. One  dose is recommended after age 51. Pneumococcal polysaccharide (PPSV23) vaccine. One dose is recommended after age 62. Talk to your health care provider about which screenings and vaccines you need and how often you need them. This information is not intended to replace advice given to you by your health care provider. Make sure you discuss any questions you have with your health care provider. Document Released: 06/23/2015 Document Revised: 02/14/2016 Document Reviewed: 03/28/2015 Elsevier Interactive Patient Education  2017 [place] Prevention in the Home Falls can cause injuries. They can happen to people of all ages. There are many things you can do to make your home safe and to help prevent falls. What can I do on the outside of my home? Regularly fix the edges of walkways and driveways and fix any cracks. Remove anything that might make you trip as you walk through a door, such as a raised step or threshold. Trim any bushes or trees on the path to your home. Use bright outdoor lighting. Clear any walking paths of anything that might make someone trip, such as rocks or tools. Regularly check to see if handrails are loose or broken. Make sure that both sides of any steps have handrails. Any raised decks and porches should have guardrails on the edges. Have any leaves, snow, or ice cleared regularly. Use sand or salt on walking paths during winter. Clean up any spills in your garage right away. This includes oil or grease spills. What can I do in the bathroom? Use night lights. Install grab bars by the toilet and in the tub and shower. Do not use towel bars as grab bars. Use non-skid mats or decals in the tub or shower. If you need to sit down in the shower, use a plastic, non-slip stool. Keep the floor dry. Clean up any water that spills on the floor as soon as it happens. Remove soap buildup in the tub or shower regularly. Attach bath mats securely with double-sided  non-slip rug tape. Do not have throw rugs and other things on the floor that can make you trip. What can I do in the bedroom? Use night lights. Make sure that you have a light by your bed that is easy to reach. Do not use any sheets or blankets that are too big for your bed. They should not hang down onto the floor. Have a firm chair that has side arms. You can use this for support while you get dressed. Do not have throw rugs and other things on the floor that can make you trip. What can I do in the kitchen? Clean up any spills right away. Avoid walking on wet floors. Keep items that you use a lot in easy-to-reach places. If you need to reach something above you, use a strong step stool that has a grab bar. Keep electrical cords out of the way. Do not use floor polish or wax that makes floors slippery. If you must use wax, use non-skid floor wax. Do not have throw rugs and other things on the floor that can make you trip. What can I do with my stairs? Do not leave any items on the stairs. Make sure that there are handrails on both sides of the stairs and use them. Fix handrails that are broken or loose. Make sure that handrails are as long as the stairways. Check any carpeting to make sure that it is firmly  attached to the stairs. Fix any carpet that is loose or worn. Avoid having throw rugs at the top or bottom of the stairs. If you do have throw rugs, attach them to the floor with carpet tape. Make sure that you have a light switch at the top of the stairs and the bottom of the stairs. If you do not have them, ask someone to add them for you. What else can I do to help prevent falls? Wear shoes that: Do not have high heels. Have rubber bottoms. Are comfortable and fit you well. Are closed at the toe. Do not wear sandals. If you use a stepladder: Make sure that it is fully opened. Do not climb a closed stepladder. Make sure that both sides of the stepladder are locked into place. Ask  someone to hold it for you, if possible. Clearly mark and make sure that you can see: Any grab bars or handrails. First and last steps. Where the edge of each step is. Use tools that help you move around (mobility aids) if they are needed. These include: Canes. Walkers. Scooters. Crutches. Turn on the lights when you go into a dark area. Replace any light bulbs as soon as they burn out. Set up your furniture so you have a clear path. Avoid moving your furniture around. If any of your floors are uneven, fix them. If there are any pets around you, be aware of where they are. Review your medicines with your doctor. Some medicines can make you feel dizzy. This can increase your chance of falling. Ask your doctor what other things that you can do to help prevent falls. This information is not intended to replace advice given to you by your health care provider. Make sure you discuss any questions you have with your health care provider. Document Released: 03/23/2009 Document Revised: 11/02/2015 Document Reviewed: 07/01/2014 Elsevier Interactive Patient Education  2017 [employer].

## 2021-12-23 ENCOUNTER — Other Ambulatory Visit: Payer: Self-pay | Admitting: Internal Medicine

## 2021-12-23 DIAGNOSIS — I10 Essential (primary) hypertension: Secondary | ICD-10-CM

## 2021-12-25 NOTE — Telephone Encounter (Signed)
Requested Prescriptions  Pending Prescriptions Disp Refills  . amLODipine (NORVASC) 5 MG tablet [Pharmacy Med Name: amLODIPine Besylate 5 MG Oral Tablet] 90 tablet 0    Sig: Take 1 tablet by mouth once daily     Cardiovascular: Calcium Channel Blockers 2 Passed - 12/23/2021 11:47 AM      Passed - Last BP in normal range    BP Readings from Last 1 Encounters:  11/22/21 126/84         Passed - Last Heart Rate in normal range    Pulse Readings from Last 1 Encounters:  11/22/21 67         Passed - Valid encounter within last 6 months    Recent Outpatient Visits          1 month ago Prediabetes   [facility] Glean Hess, MD   5 months ago Essential hypertension   [facility] Glean Hess, MD      Future Appointments            In 1 week [place] Melia Jesse Sans, MD [facility], [facility]

## 2022-01-01 ENCOUNTER — Ambulatory Visit: Payer: Medicare Other | Admitting: Internal Medicine

## 2022-01-24 ENCOUNTER — Other Ambulatory Visit: Payer: Self-pay | Admitting: Internal Medicine

## 2022-01-24 NOTE — Telephone Encounter (Signed)
Medication Refill - Medication: lisinopril (ZESTRIL) 40 MG tablet  90 day supply   Has the patient contacted their pharmacy? Yes.  (Agent: If no, request that the patient contact the pharmacy for the refill. If patient does not wish to contact the pharmacy document the reason why and proceed with request.) (Agent: If yes, when and what did the pharmacy advise?)  Preferred Pharmacy (with phone number or street name):  [facility] [address], [place] - [place] [address]  Phone: [phone] Fax: [phone]   Has the patient been seen for an appointment in the last year OR does the patient have an upcoming appointment? Yes.    Agent: Please be advised that RX refills may take up to 3 business days. We ask that you follow-up with your pharmacy.

## 2022-01-25 MED ORDER — LISINOPRIL 40 MG PO TABS: 40.0000 mg | ORAL_TABLET | Freq: Every day | ORAL | 0 refills | Status: DC

## 2022-01-25 NOTE — Telephone Encounter (Signed)
Appt scheduled  KP

## 2022-01-25 NOTE — Telephone Encounter (Signed)
Please review.  KP

## 2022-01-25 NOTE — Telephone Encounter (Signed)
Requested medication (s) are due for refill today: historical medication  Requested medication (s) are on the active medication list: yes  Last refill:  na  Future visit scheduled: no  Notes to clinic: historical medication. Do you want to order medication ?     Requested Prescriptions  Pending Prescriptions Disp Refills   lisinopril (ZESTRIL) 40 MG tablet      Sig: Take 1 tablet (40 mg total) by mouth daily.     Cardiovascular:  ACE Inhibitors Failed - 01/24/2022 10:38 AM      Failed - Cr in normal range and within 180 days    Creatinine, Ser  Date Value Ref Range Status  07/04/2021 0.86 0.57 - 1.00 mg/dL Final         Failed - K in normal range and within 180 days    Potassium  Date Value Ref Range Status  07/04/2021 4.2 3.5 - 5.2 mmol/L Final         Passed - Patient is not pregnant      Passed - Last BP in normal range    BP Readings from Last 1 Encounters:  11/22/21 126/84         Passed - Valid encounter within last 6 months    Recent Outpatient Visits           2 months ago Prediabetes   [facility], Jesse Sans, MD   6 months ago Essential hypertension   [facility], Jesse Sans, MD

## 2022-03-19 ENCOUNTER — Ambulatory Visit (INDEPENDENT_AMBULATORY_CARE_PROVIDER_SITE_OTHER): Payer: Medicare Other | Admitting: Internal Medicine

## 2022-03-19 ENCOUNTER — Encounter: Payer: Self-pay | Admitting: Internal Medicine

## 2022-03-19 VITALS — BP 128/68 | HR 84 | Ht 68.0 in | Wt 154.2 lb

## 2022-03-19 DIAGNOSIS — E785 Hyperlipidemia, unspecified: Secondary | ICD-10-CM

## 2022-03-19 DIAGNOSIS — R7303 Prediabetes: Secondary | ICD-10-CM | POA: Diagnosis not present

## 2022-03-19 DIAGNOSIS — M810 Age-related osteoporosis without current pathological fracture: Secondary | ICD-10-CM | POA: Diagnosis not present

## 2022-03-19 DIAGNOSIS — I10 Essential (primary) hypertension: Secondary | ICD-10-CM

## 2022-03-19 MED ORDER — LISINOPRIL 40 MG PO TABS: 40.0000 mg | ORAL_TABLET | Freq: Every day | ORAL | 1 refills | Status: DC

## 2022-03-19 MED ORDER — AMLODIPINE BESYLATE 5 MG PO TABS: 5.0000 mg | ORAL_TABLET | Freq: Every day | ORAL | 1 refills | Status: DC

## 2022-03-19 MED ORDER — ESTRADIOL 0.1 MG/GM VA CREA: TOPICAL_CREAM | VAGINAL | 1 refills | Status: AC

## 2022-03-19 NOTE — Progress Notes (Signed)
Date:  03/19/2022   Name:  Jenna Sheppard   DOB:  06-03-1946   MRN:  [id number]   Chief Complaint: Hypertension and Diabetes  Diabetes She presents for her follow-up diabetic visit. Diabetes type: prediabetes. Her disease course has been improving (6.8 down to 6.3 in June). Pertinent negatives for hypoglycemia include no dizziness, headaches or nervousness/anxiousness. Pertinent negatives for diabetes include no chest pain, no fatigue and no weakness. Symptoms are improving.  Hypertension This is a chronic problem. The problem is controlled. Pertinent negatives include no chest pain, headaches, palpitations or shortness of breath. Past treatments include ACE inhibitors and calcium channel blockers.  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 Fosamax started.  No vitamin d levels done - only on multivitamin daily. Also taking Calcium and vitamin D.  Lab Results  Component Value Date   NA 145 (H) 07/04/2021   K 4.2 07/04/2021   CO2 25 07/04/2021   GLUCOSE 121 (H) 07/04/2021   BUN 18 07/04/2021   CREATININE 0.86 07/04/2021   CALCIUM 9.2 07/04/2021   EGFR 70 07/04/2021   No results found for: "CHOL", "HDL", "LDLCALC", "LDLDIRECT", "TRIG", "CHOLHDL" No results found for: "TSH" Lab Results  Component Value Date   HGBA1C 6.3 (A) 11/22/2021   Lab Results  Component Value Date   WBC 4.9 07/19/2021   HGB 12.6 07/19/2021   HCT 37.7 07/19/2021   MCV 93 07/19/2021   PLT CANCELED 07/04/2021   Lab Results  Component Value Date   ALT 19 07/04/2021   AST 19 07/04/2021   ALKPHOS 78 07/04/2021   BILITOT 0.5 07/04/2021   No results found for: "25OHVITD2", "25OHVITD3", "VD25OH"   Review of Systems  Constitutional:  Negative for fatigue and unexpected weight change.  HENT:  Negative for nosebleeds.   Eyes:  Negative for visual disturbance.  Respiratory:  Negative for cough, chest tightness, shortness of breath and wheezing.   Cardiovascular:  Negative for chest pain, palpitations and leg swelling.   Gastrointestinal:  Negative for abdominal pain, constipation and diarrhea.  Musculoskeletal:  Negative for arthralgias.  Neurological:  Negative for dizziness, weakness, light-headedness and headaches.  Psychiatric/Behavioral:  Positive for sleep disturbance. Negative for dysphoric mood. The patient is not nervous/anxious.     Patient Active Problem List   Diagnosis Date Noted   Neoplasm of uncertain behavior of skin 11/22/2021   Age-related osteoporosis without current pathological fracture 09/04/2021   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   Ovarian failure 07/04/2021   Prediabetes 07/04/2021   Varicose veins of both lower extremities 07/04/2021   Essential hypertension 10/02/2016   Mixed hyperlipidemia 10/02/2016    Allergies  Allergen Reactions   Molds & Smuts Shortness Of Breath   Red Dye Rash   Sulfa Antibiotics Nausea Only    Past Surgical History:  Procedure Laterality Date   ABDOMINAL HYSTERECTOMY  01/21/2005   Still has 1 ovary   ABLATION ON ENDOMETRIOSIS  01/08/1990   BREAST BIOPSY Right    benign, 20 years ago   BREAST SURGERY  02/16/1985   2nd surgery - 04/29/1996   CHOLECYSTECTOMY  07/21/2000   DILATION AND CURETTAGE OF UTERUS  02/18/1989   laser eye  02/09/2010   WRIST ARTHROSCOPY Left 04/01/2017    Social History   Tobacco Use   Smoking status: Never   Smokeless tobacco: Never  Vaping Use   Vaping Use: Never used  Substance Use Topics   Alcohol use: Yes    Comment: RARE   Drug use:  Never     Medication list has been reviewed and updated.  Current Meds  Medication Sig   alendronate (FOSAMAX) 70 MG tablet Take 1 tablet (70 mg total) by mouth every 7 (seven) days. Take with a full glass of water on an empty stomach.   amLODipine (NORVASC) 5 MG tablet Take 1 tablet by mouth once daily   calcium carbonate (OS-CAL - DOSED IN MG OF ELEMENTAL CALCIUM) 1250 (500 Ca) MG tablet Take 1 tablet by mouth.   co-enzyme Q-10 30 MG capsule Take  30 mg by mouth 3 (three) times daily.   diclofenac Sodium (VOLTAREN) 1 % GEL Apply topically 4 (four) times daily. PRN   estradiol (ESTRACE) 0.1 MG/GM vaginal cream estradiol 0.01% (0.1 mg/gram) vaginal cream  APPLY AS DIRECTED BY TOPICAL ROUTE EVERY OTHER DAY THREE TIMES A WEEK USING FINGERTIP   glucose blood test strip OneTouch Ultra Test strips  TEST ONCE DAILY   Krill Oil 1000 MG CAPS Take by mouth.   Lancets (ONETOUCH DELICA PLUS LANCET33G) MISC OneTouch Delica Plus Lancet 33 gauge  USE ONE LANCET ONE TIME DAILY FOR CHECKING OF BLOOD GLUCOSE   lisinopril (ZESTRIL) 40 MG tablet Take 1 tablet (40 mg total) by mouth daily.   Multiple Vitamins-Minerals (MULTIVITAMIN WITH MINERALS) tablet Take 1 tablet by mouth daily.       07/04/2021    2:52 PM  GAD 7 : Generalized Anxiety Score  Nervous, Anxious, on Edge 1  Control/stop worrying 1  Worry too much - different things 1  Trouble relaxing 0  Restless 0  Easily annoyed or irritable 3  Afraid - awful might happen 0  Total GAD 7 Score 6       11/28/2021    8:24 AM 07/04/2021    2:52 PM  Depression screen PHQ 2/9  Decreased Interest 0 0  Down, Depressed, Hopeless 0 0  PHQ - 2 Score 0 0  Altered sleeping  0  Tired, decreased energy  0  Change in appetite  0  Feeling bad or failure about yourself   0  Trouble concentrating  0  Moving slowly or fidgety/restless  0  Suicidal thoughts  0  PHQ-9 Score  0  Difficult doing work/chores  Not difficult at all    BP Readings from Last 3 Encounters:  03/19/22 128/68  11/22/21 126/84  07/04/21 (!) 144/85    Physical Exam Vitals and nursing note reviewed.  Constitutional:      General: She is not in acute distress.    Appearance: Normal appearance. She is well-developed.  HENT:     Head: Normocephalic and atraumatic.  Cardiovascular:     Rate and Rhythm: Normal rate and regular rhythm.  Pulmonary:     Effort: Pulmonary effort is normal. No respiratory distress.     Breath sounds:  No wheezing or rhonchi.  Musculoskeletal:     Cervical back: Normal range of motion.     Right lower leg: No edema.     Left lower leg: No edema.  Lymphadenopathy:     Cervical: No cervical adenopathy.  Skin:    General: Skin is warm and dry.     Findings: No rash.  Neurological:     General: No focal deficit present.     Mental Status: She is alert and oriented to person, place, and time.  Psychiatric:        Mood and Affect: Mood normal.        Behavior: Behavior normal.  Wt Readings from Last 3 Encounters:  03/19/22 154 lb 3.2 oz (69.9 kg)  11/22/21 153 lb 9.6 oz (69.7 kg)  07/04/21 162 lb (73.5 kg)    BP 128/68   Pulse 84   Ht $R'5\' 8"'ls$  (1.727 m)   Wt 154 lb 3.2 oz (69.9 kg)   SpO2 96%   BMI 23.45 kg/m   Assessment and Plan: 1. Essential hypertension Clinically stable exam with well controlled BP. Tolerating medications without side effects at this time. Pt to continue current regimen and low sodium diet; benefits of regular exercise as able discussed. - amLODipine (NORVASC) 5 MG tablet; Take 1 tablet (5 mg total) by mouth daily.  Dispense: 90 tablet; Refill: 1 - lisinopril (ZESTRIL) 40 MG tablet; Take 1 tablet (40 mg total) by mouth daily.  Dispense: 90 tablet; Refill: 1  2. Prediabetes Continue dietary changes Continue to monitor home BS weekly - Hemoglobin A1c  3. Age-related osteoporosis without current pathological fracture On Fosamax and tolerating this well without side effects, abdominal pain or dysphagia. - VITAMIN D 25 Hydroxy (Vit-D Deficiency, Fractures)  4. Mild hyperlipidemia Check labs and advise Continue Krill oil supplement - Lipid panel   Partially dictated using Editor, commissioning. Any errors are unintentional.  Halina Maidens, MD [facility]  03/19/2022

## 2022-03-20 ENCOUNTER — Other Ambulatory Visit: Payer: Self-pay

## 2022-03-20 LAB — LIPID PANEL
Chol/HDL Ratio: 2.5 ratio (ref 0.0–4.4)
Cholesterol, Total: 174 mg/dL (ref 100–199)
HDL: 70 mg/dL (ref >39)
LDL Chol Calc (NIH): 79 mg/dL (ref 0–99)
Triglycerides: 146 mg/dL (ref 0–149)
VLDL Cholesterol Cal: 25 mg/dL (ref 5–40)

## 2022-03-20 LAB — VITAMIN D 25 HYDROXY (VIT D DEFICIENCY, FRACTURES): Vit D, 25-Hydroxy: 44.6 ng/mL (ref 30.0–100.0)

## 2022-03-20 LAB — HEMOGLOBIN A1C
Est. average glucose Bld gHb Est-mCnc: 160 mg/dL
Hgb A1c MFr Bld: 7.2 % — ABNORMAL HIGH (ref 4.8–5.6)

## 2022-03-20 MED ORDER — METFORMIN HCL 500 MG PO TABS: 500.0000 mg | ORAL_TABLET | Freq: Every day | ORAL | 1 refills | Status: DC

## 2022-07-22 ENCOUNTER — Encounter: Payer: Self-pay | Admitting: Internal Medicine

## 2022-07-22 ENCOUNTER — Ambulatory Visit (INDEPENDENT_AMBULATORY_CARE_PROVIDER_SITE_OTHER): Payer: Medicare Other | Admitting: Internal Medicine

## 2022-07-22 VITALS — BP 134/80 | HR 84 | Ht 68.0 in | Wt 151.0 lb

## 2022-07-22 DIAGNOSIS — I10 Essential (primary) hypertension: Secondary | ICD-10-CM | POA: Diagnosis not present

## 2022-07-22 DIAGNOSIS — J0100 Acute maxillary sinusitis, unspecified: Secondary | ICD-10-CM

## 2022-07-22 DIAGNOSIS — Z1211 Encounter for screening for malignant neoplasm of colon: Secondary | ICD-10-CM

## 2022-07-22 DIAGNOSIS — Z1231 Encounter for screening mammogram for malignant neoplasm of breast: Secondary | ICD-10-CM

## 2022-07-22 DIAGNOSIS — E118 Type 2 diabetes mellitus with unspecified complications: Secondary | ICD-10-CM

## 2022-07-22 DIAGNOSIS — M810 Age-related osteoporosis without current pathological fracture: Secondary | ICD-10-CM

## 2022-07-22 DIAGNOSIS — E782 Mixed hyperlipidemia: Secondary | ICD-10-CM | POA: Diagnosis not present

## 2022-07-22 MED ORDER — ATORVASTATIN CALCIUM 10 MG PO TABS: 10.0000 mg | ORAL_TABLET | Freq: Every day | ORAL | 3 refills | Status: DC

## 2022-07-22 MED ORDER — AZITHROMYCIN 250 MG PO TABS: ORAL_TABLET | ORAL | 0 refills | Status: AC

## 2022-07-22 NOTE — Assessment & Plan Note (Addendum)
Cholesterol borderline elevated Recent dx of AODM Statin recommended - pt agrees

## 2022-07-22 NOTE — Progress Notes (Signed)
Date:  07/22/2022   Name:  Jenna Sheppard   DOB:  05-17-1946   MRN:  [id number]   Chief Complaint: Annual Exam (Foot exam, Breast Exam.) and Sinusitis (Facial pain and pressure.) Jenna Sheppard is a 77 y.o. female who presents today for her Complete Annual Exam. She feels fairly well. She reports exercising. She reports she is sleeping well. Breast complaints - none.  Mammogram: 07/2021 DEXA: 08/2021 osteoporosis hip Colonoscopy: 02/2020 repeat 5 yrs?  Health Maintenance Due  Topic Date Due   OPHTHALMOLOGY EXAM  Never done   Diabetic kidney evaluation - Urine ACR  10/02/2021   COVID-19 Vaccine (7 - 2023-24 season) 04/24/2022   Diabetic kidney evaluation - eGFR measurement  07/04/2022    Immunization History  Administered Date(s) Administered   Influenza-Unspecified 03/08/2021, 03/04/2022   Moderna Covid-19 Vaccine Bivalent Booster 71yr & up 10/16/2021   PFIZER Comirnaty(Gray Top)Covid-19 Tri-Sucrose Vaccine 07/10/2019, 07/31/2019, 09/12/2020, 02/23/2021   PFIZER(Purple Top)SARS-COV-2 Vaccination 02/27/2022   Pneumococcal Conjugate-13 04/04/2016   Pneumococcal Polysaccharide-23 06/11/2007, 10/04/2015   Tdap 11/01/2021   Tetanus Immune Globulin 04/04/2016   Zoster Recombinat (Shingrix) 11/19/2016, 01/18/2017    Hypertension This is a chronic problem. The problem is controlled. Pertinent negatives include no chest pain, headaches, palpitations or shortness of breath. Past treatments include calcium channel blockers and ACE inhibitors. The current treatment provides significant improvement. There is no history of kidney disease, CAD/MI or CVA.  Diabetes She presents for her follow-up diabetic visit. She has type 2 (recently prediabetic now type 2) diabetes mellitus. Her disease course has been improving. Pertinent negatives for hypoglycemia include no dizziness, headaches, nervousness/anxiousness or tremors. Pertinent negatives for diabetes include no chest pain, no fatigue, no  polydipsia and no polyuria. Pertinent negatives for diabetic complications include no CVA. Current diabetic treatment includes oral agent (monotherapy) (metformin).  Hyperlipidemia This is a chronic problem. The problem is uncontrolled. Recent lipid tests were reviewed and are high. Pertinent negatives include no chest pain or shortness of breath. She is currently on no antihyperlipidemic treatment.  Sinus Problem This is a new problem. The current episode started in the past 7 days. The problem has been gradually worsening since onset. There has been no fever. Pertinent negatives include no chills, congestion, coughing, headaches or shortness of breath.    Lab Results  Component Value Date   NA 145 (H) 07/04/2021   K 4.2 07/04/2021   CO2 25 07/04/2021   GLUCOSE 121 (H) 07/04/2021   BUN 18 07/04/2021   CREATININE 0.86 07/04/2021   CALCIUM 9.2 07/04/2021   EGFR 70 07/04/2021   Lab Results  Component Value Date   CHOL 174 03/19/2022   HDL 70 03/19/2022   LDLCALC 79 03/19/2022   TRIG 146 03/19/2022   CHOLHDL 2.5 03/19/2022   No results found for: "TSH" Lab Results  Component Value Date   HGBA1C 7.2 (H) 03/19/2022   Lab Results  Component Value Date   WBC 4.9 07/19/2021   HGB 12.6 07/19/2021   HCT 37.7 07/19/2021   MCV 93 07/19/2021   PLT CANCELED 07/04/2021   Lab Results  Component Value Date   ALT 19 07/04/2021   AST 19 07/04/2021   ALKPHOS 78 07/04/2021   BILITOT 0.5 07/04/2021   Lab Results  Component Value Date   VD25OH 44.6 03/19/2022     Review of Systems  Constitutional:  Negative for chills, fatigue and fever.  HENT:  Negative for congestion, hearing loss, tinnitus, trouble swallowing and voice change.  Eyes:  Negative for visual disturbance.  Respiratory:  Negative for cough, chest tightness, shortness of breath and wheezing.   Cardiovascular:  Negative for chest pain, palpitations and leg swelling.  Gastrointestinal:  Negative for abdominal pain,  constipation, diarrhea and vomiting.  Endocrine: Negative for polydipsia and polyuria.  Genitourinary:  Negative for dysuria, frequency, genital sores, vaginal bleeding and vaginal discharge.  Musculoskeletal:  Negative for arthralgias, gait problem and joint swelling.  Skin:  Negative for color change and rash.  Neurological:  Negative for dizziness, tremors, light-headedness and headaches.  Hematological:  Negative for adenopathy. Does not bruise/bleed easily.  Psychiatric/Behavioral:  Negative for dysphoric mood and sleep disturbance. The patient is not nervous/anxious.     Patient Active Problem List   Diagnosis Date Noted   Neoplasm of uncertain behavior of skin 11/22/2021   Age-related osteoporosis without current pathological fracture 09/04/2021   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   Ovarian failure 07/04/2021   Type II diabetes mellitus with complication ([place]) [id number]   Varicose veins of both lower extremities 07/04/2021   Essential hypertension 10/02/2016   Mixed hyperlipidemia 10/02/2016    Allergies  Allergen Reactions   Molds & Smuts Shortness Of Breath   Red Dye Rash   Sulfa Antibiotics Nausea Only    Past Surgical History:  Procedure Laterality Date   ABDOMINAL HYSTERECTOMY  01/21/2005   Still has 1 ovary   ABLATION ON ENDOMETRIOSIS  01/08/1990   BREAST BIOPSY Right    benign, 20 years ago   BREAST SURGERY  02/16/1985   2nd surgery - 04/29/1996   CHOLECYSTECTOMY  07/21/2000   DILATION AND CURETTAGE OF UTERUS  02/18/1989   laser eye  02/09/2010   WRIST ARTHROSCOPY Left 04/01/2017    Social History   Tobacco Use   Smoking status: Never   Smokeless tobacco: Never  Vaping Use   Vaping Use: Never used  Substance Use Topics   Alcohol use: Yes    Alcohol/week: 1.0 standard drink of alcohol    Types: 1 Glasses of wine per week    Comment: RARE   Drug use: Never     Medication list has been reviewed and updated.  Current Meds   Medication Sig   alendronate (FOSAMAX) 70 MG tablet Take 1 tablet (70 mg total) by mouth every 7 (seven) days. Take with a full glass of water on an empty stomach.   amLODipine (NORVASC) 5 MG tablet Take 1 tablet (5 mg total) by mouth daily.   atorvastatin (LIPITOR) 10 MG tablet Take 1 tablet (10 mg total) by mouth daily.   azithromycin (ZITHROMAX Z-PAK) 250 MG tablet UAD   calcium carbonate (OS-CAL - DOSED IN MG OF ELEMENTAL CALCIUM) 1250 (500 Ca) MG tablet Take 1 tablet by mouth.   co-enzyme Q-10 30 MG capsule Take 30 mg by mouth 3 (three) times daily.   diclofenac Sodium (VOLTAREN) 1 % GEL Apply topically 4 (four) times daily. PRN   estradiol (ESTRACE) 0.1 MG/GM vaginal cream estradiol 0.01% (0.1 mg/gram) vaginal cream  APPLY AS DIRECTED BY TOPICAL ROUTE EVERY OTHER DAY THREE TIMES A WEEK USING FINGERTIP   glucose blood test strip OneTouch Ultra Test strips  TEST ONCE DAILY   Krill Oil 1000 MG CAPS Take by mouth.   Lancets (ONETOUCH DELICA PLUS 123XX123) MISC OneTouch Delica Plus Lancet 33 gauge  USE ONE LANCET ONE TIME DAILY FOR CHECKING OF BLOOD GLUCOSE   lisinopril (ZESTRIL) 40 MG tablet Take 1 tablet (40 mg total) by  mouth daily.   metFORMIN (GLUCOPHAGE) 500 MG tablet Take 1 tablet (500 mg total) by mouth daily with breakfast.   Multiple Vitamins-Minerals (MULTIVITAMIN WITH MINERALS) tablet Take 1 tablet by mouth daily.       07/22/2022    8:21 AM 03/19/2022    1:19 PM 07/04/2021    2:52 PM  GAD 7 : Generalized Anxiety Score  Nervous, Anxious, on Edge 0 1 1  Control/stop worrying 0 1 1  Worry too much - different things 0 1 1  Trouble relaxing 0 1 0  Restless 0 1 0  Easily annoyed or irritable 0 1 3  Afraid - awful might happen 0 0 0  Total GAD 7 Score 0 6 6  Anxiety Difficulty Not difficult at all Not difficult at all        07/22/2022    8:20 AM 03/19/2022    1:19 PM 11/28/2021    8:24 AM  Depression screen PHQ 2/9  Decreased Interest 0 0 0  Down, Depressed, Hopeless  0 0 0  PHQ - 2 Score 0 0 0  Altered sleeping 0 1   Tired, decreased energy 0 0   Change in appetite 0 0   Feeling bad or failure about yourself  0 0   Trouble concentrating 0 0   Moving slowly or fidgety/restless 0 0   Suicidal thoughts 0 0   PHQ-9 Score 0 1   Difficult doing work/chores Not difficult at all Not difficult at all     BP Readings from Last 3 Encounters:  07/22/22 134/80  03/19/22 128/68  11/22/21 126/84    Physical Exam Vitals and nursing note reviewed.  Constitutional:      General: She is not in acute distress.    Appearance: She is well-developed.  HENT:     Head: Normocephalic and atraumatic.     Right Ear: Tympanic membrane and ear canal normal.     Left Ear: Tympanic membrane and ear canal normal.     Nose:     Right Sinus: No maxillary sinus tenderness.     Left Sinus: No maxillary sinus tenderness.  Eyes:     General: No scleral icterus.       Right eye: No discharge.        Left eye: No discharge.     Conjunctiva/sclera: Conjunctivae normal.  Neck:     Thyroid: No thyromegaly.     Vascular: No carotid bruit.  Cardiovascular:     Rate and Rhythm: Normal rate and regular rhythm.     Pulses: Normal pulses.     Heart sounds: Normal heart sounds.  Pulmonary:     Effort: Pulmonary effort is normal. No respiratory distress.     Breath sounds: No wheezing.  Chest:  Breasts:    Right: No mass, nipple discharge, skin change or tenderness.     Left: No mass, nipple discharge, skin change or tenderness.  Abdominal:     General: Bowel sounds are normal.     Palpations: Abdomen is soft.     Tenderness: There is no abdominal tenderness.  Musculoskeletal:     Cervical back: Normal range of motion. No erythema.     Right lower leg: No edema.     Left lower leg: No edema.  Lymphadenopathy:     Cervical: No cervical adenopathy.  Skin:    General: Skin is warm and dry.     Findings: No rash.  Neurological:     Mental Status: She  is alert and  oriented to person, place, and time.     Cranial Nerves: No cranial nerve deficit.     Sensory: No sensory deficit.     Deep Tendon Reflexes: Reflexes are normal and symmetric.  Psychiatric:        Attention and Perception: Attention normal.        Mood and Affect: Mood normal.    Diabetic Foot Exam - Simple   Simple Foot Form Diabetic Foot exam was performed with the following findings: Yes 07/22/2022  8:41 AM  Visual Inspection No deformities, no ulcerations, no other skin breakdown bilaterally: Yes Sensation Testing Intact to touch and monofilament testing bilaterally: Yes Pulse Check Posterior Tibialis and Dorsalis pulse intact bilaterally: Yes Comments      Wt Readings from Last 3 Encounters:  07/22/22 151 lb (68.5 kg)  03/19/22 154 lb 3.2 oz (69.9 kg)  11/22/21 153 lb 9.6 oz (69.7 kg)    BP 134/80 (BP Location: Left Arm, Cuff Size: Normal)   Pulse 84   Ht 5' 8"$  (1.727 m)   Wt 151 lb (68.5 kg)   SpO2 98%   BMI 22.96 kg/m   Assessment and Plan: Problem List Items Addressed This Visit       Cardiovascular and Mediastinum   Essential hypertension - Primary (Chronic)    Clinically stable exam with well controlled BP on amlodipine and lisinopril. Tolerating medications without side effects. Pt to continue current regimen and low sodium diet.       Relevant Medications   atorvastatin (LIPITOR) 10 MG tablet   Other Relevant Orders   CBC with Differential/Platelet   TSH     Endocrine   Type II diabetes mellitus with complication (HCC)    Last A1C up to 7.2 Metformin started without side effects AM blood sugars around 150. Will check labs and advise on metformin dose change      Relevant Medications   atorvastatin (LIPITOR) 10 MG tablet   Other Relevant Orders   Comprehensive metabolic panel   Hemoglobin A1c   Microalbumin / creatinine urine ratio     Musculoskeletal and Integument   Age-related osteoporosis without current pathological fracture  (Chronic)    DEXA 2023 showed osteopenia Fosamax started along with calcium and vitamin D. Recommend continued treatment and repeat DEXA next year        Other   Mixed hyperlipidemia (Chronic)    Cholesterol borderline elevated Recent dx of AODM Statin recommended - pt agrees      Relevant Medications   atorvastatin (LIPITOR) 10 MG tablet   Other Relevant Orders   Lipid panel   Other Visit Diagnoses     Encounter for screening mammogram for breast cancer       Relevant Orders   MM 3D SCREEN BREAST BILATERAL   Colon cancer screening       Normal in 2021 repeat due 2026   Acute non-recurrent maxillary sinusitis       continue Mucinex return if symptoms persist after antibiotics   Relevant Medications   azithromycin (ZITHROMAX Z-PAK) 250 MG tablet        Partially dictated using Editor, commissioning. Any errors are unintentional.  Halina Maidens, MD [facility]  07/22/2022

## 2022-07-22 NOTE — Patient Instructions (Signed)
Call [facility] to schedule your mammogram at [phone].  

## 2022-07-22 NOTE — Assessment & Plan Note (Addendum)
Last A1C up to 7.2 Metformin started without side effects AM blood sugars around 150. Will check labs and advise on metformin dose change

## 2022-07-22 NOTE — Assessment & Plan Note (Addendum)
DEXA 2023 showed osteopenia Fosamax started along with calcium and vitamin D. Recommend continued treatment and repeat DEXA next year

## 2022-07-22 NOTE — Assessment & Plan Note (Signed)
Clinically stable exam with well controlled BP on amlodipine and lisinopril. Tolerating medications without side effects. Pt to continue current regimen and low sodium diet.

## 2022-07-24 LAB — COMPREHENSIVE METABOLIC PANEL
ALT: 21 IU/L (ref 0–32)
AST: 19 IU/L (ref 0–40)
Albumin/Globulin Ratio: 2 (ref 1.2–2.2)
Albumin: 4.3 g/dL (ref 3.8–4.8)
Alkaline Phosphatase: 68 IU/L (ref 44–121)
BUN/Creatinine Ratio: 20 (ref 12–28)
BUN: 16 mg/dL (ref 8–27)
Bilirubin Total: 0.6 mg/dL (ref 0.0–1.2)
CO2: 22 mmol/L (ref 20–29)
Calcium: 9.6 mg/dL (ref 8.7–10.3)
Chloride: 105 mmol/L (ref 96–106)
Creatinine, Ser: 0.79 mg/dL (ref 0.57–1.00)
Globulin, Total: 2.2 g/dL (ref 1.5–4.5)
Glucose: 142 mg/dL — ABNORMAL HIGH (ref 70–99)
Potassium: 4.4 mmol/L (ref 3.5–5.2)
Sodium: 142 mmol/L (ref 134–144)
Total Protein: 6.5 g/dL (ref 6.0–8.5)
eGFR: 77 mL/min/{1.73_m2} (ref >59)

## 2022-07-24 LAB — CBC WITH DIFFERENTIAL/PLATELET
Basophils Absolute: 0 10*3/uL (ref 0.0–0.2)
Basos: 1 %
EOS (ABSOLUTE): 0.2 10*3/uL (ref 0.0–0.4)
Eos: 3 %
Hematocrit: 37.6 % (ref 34.0–46.6)
Hemoglobin: 12.6 g/dL (ref 11.1–15.9)
Immature Grans (Abs): 0 10*3/uL (ref 0.0–0.1)
Immature Granulocytes: 0 %
Lymphocytes Absolute: 1.5 10*3/uL (ref 0.7–3.1)
Lymphs: 28 %
MCH: 30.9 pg (ref 26.6–33.0)
MCHC: 33.5 g/dL (ref 31.5–35.7)
MCV: 92 fL (ref 79–97)
Monocytes Absolute: 0.4 10*3/uL (ref 0.1–0.9)
Monocytes: 8 %
Neutrophils Absolute: 3.1 10*3/uL (ref 1.4–7.0)
Neutrophils: 60 %
Platelets: 173 10*3/uL (ref 150–450)
RBC: 4.08 x10E6/uL (ref 3.77–5.28)
RDW: 12.5 % (ref 11.7–15.4)
WBC: 5.2 10*3/uL (ref 3.4–10.8)

## 2022-07-24 LAB — HEMOGLOBIN A1C
Est. average glucose Bld gHb Est-mCnc: 154 mg/dL
Hgb A1c MFr Bld: 7 % — ABNORMAL HIGH (ref 4.8–5.6)

## 2022-07-24 LAB — LIPID PANEL
Chol/HDL Ratio: 2.4 ratio (ref 0.0–4.4)
Cholesterol, Total: 189 mg/dL (ref 100–199)
HDL: 80 mg/dL (ref >39)
LDL Chol Calc (NIH): 88 mg/dL (ref 0–99)
Triglycerides: 124 mg/dL (ref 0–149)
VLDL Cholesterol Cal: 21 mg/dL (ref 5–40)

## 2022-07-24 LAB — MICROALBUMIN / CREATININE URINE RATIO
Creatinine, Urine: 133.3 mg/dL
Microalb/Creat Ratio: 44 mg/g creat — ABNORMAL HIGH (ref 0–29)
Microalbumin, Urine: 58.6 ug/mL

## 2022-07-24 LAB — TSH: TSH: 2.09 u[IU]/mL (ref 0.450–4.500)

## 2022-08-18 DIAGNOSIS — I82890 Acute embolism and thrombosis of other specified veins: Secondary | ICD-10-CM | POA: Insufficient documentation

## 2022-08-21 DIAGNOSIS — C259 Malignant neoplasm of pancreas, unspecified: Secondary | ICD-10-CM | POA: Insufficient documentation

## 2022-09-01 ENCOUNTER — Other Ambulatory Visit: Payer: Self-pay | Admitting: Internal Medicine

## 2022-09-01 DIAGNOSIS — M810 Age-related osteoporosis without current pathological fracture: Secondary | ICD-10-CM

## 2022-09-02 NOTE — Telephone Encounter (Signed)
Requested medication (s) are due for refill today: Due 09/05/22  Requested medication (s) are on the active medication list: yes    Last refill: 09/04/21  #12  3 refills  Future visit scheduled Yes 11/20/22  Notes to clinic: Failed due to labs, please review. Thank you.  Requested Prescriptions  Pending Prescriptions Disp Refills   alendronate (FOSAMAX) 70 MG tablet [Pharmacy Med Name: Alendronate Sodium 70 MG Oral Tablet] 12 tablet 0    Sig: TAKE 1 TABLET BY MOUTH ONCE A WEEK. TAKE ON AN EMPTY STOMACH WITH A FULL GLASS OF WATER     Endocrinology:  Bisphosphonates Failed - 09/01/2022  2:22 PM      Failed - Mg Level in normal range and within 360 days    No results found for: "MG"       Failed - Phosphate in normal range and within 360 days    No results found for: "PHOS"       Passed - Ca in normal range and within 360 days    Calcium  Date Value Ref Range Status  07/22/2022 9.6 8.7 - 10.3 mg/dL Final         Passed - Vitamin D in normal range and within 360 days    Vit D, 25-Hydroxy  Date Value Ref Range Status  03/19/2022 44.6 30.0 - 100.0 ng/mL Final    Comment:    Vitamin D deficiency has been defined by the Institute of Medicine and an Endocrine Society practice guideline as a level of serum 25-OH vitamin D less than 20 ng/mL (1,2). The Endocrine Society went on to further define vitamin D insufficiency as a level between 21 and 29 ng/mL (2). 1. IOM (Institute of Medicine). 2010. Dietary reference    intakes for calcium and D. [place]: The    [employer]. 2. Holick MF, Binkley [place], Bischoff-Ferrari HA, et al.    Evaluation, treatment, and prevention of vitamin D    deficiency: an Endocrine Society clinical practice    guideline. JCEM. 2011 Jul; 96(7):1911-30.          Passed - Cr in normal range and within 360 days    Creatinine, Ser  Date Value Ref Range Status  07/22/2022 0.79 0.57 - 1.00 mg/dL Final         Passed - eGFR is 30 or above and  within 360 days    eGFR  Date Value Ref Range Status  07/22/2022 77 >59 mL/min/1.73 Final         Passed - Valid encounter within last 12 months    Recent Outpatient Visits           1 month ago Essential hypertension   [place] [facility], Jesse Sans, MD   5 months ago Essential hypertension   [place] [facility], Jesse Sans, MD   9 months ago Prediabetes   [facility], Jesse Sans, MD   1 year ago Essential hypertension   [place] [facility], Jesse Sans, MD       Future Appointments             In 2 months [facility], Jesse Sans, MD [place] [facility] [place]  Density or Dexa Scan completed in the last 2 years

## 2022-09-20 ENCOUNTER — Other Ambulatory Visit: Payer: Self-pay | Admitting: Internal Medicine

## 2022-09-20 NOTE — Telephone Encounter (Signed)
Future visit in 2 months Requested Prescriptions  Pending Prescriptions Disp Refills   metFORMIN (GLUCOPHAGE) 500 MG tablet [Pharmacy Med Name: metFORMIN HCl 500 MG Oral Tablet] 90 tablet 0    Sig: Take 1 tablet by mouth once daily with breakfast     Endocrinology:  Diabetes - Biguanides Failed - 09/20/2022  9:46 AM      Failed - B12 Level in normal range and within 720 days    No results found for: "VITAMINB12"       Passed - Cr in normal range and within 360 days    Creatinine, Ser  Date Value Ref Range Status  07/22/2022 0.79 0.57 - 1.00 mg/dL Final         Passed - HBA1C is between 0 and 7.9 and within 180 days    Hgb A1c MFr Bld  Date Value Ref Range Status  07/22/2022 7.0 (H) 4.8 - 5.6 % Final    Comment:             Prediabetes: 5.7 - 6.4          Diabetes: >6.4          Glycemic control for adults with diabetes: <7.0          Passed - eGFR in normal range and within 360 days    eGFR  Date Value Ref Range Status  07/22/2022 77 >59 mL/min/1.73 Final         Passed - Valid encounter within last 6 months    Recent Outpatient Visits           2 months ago Essential hypertension   [place] [facility] at [facility], Nyoka Cowden, MD   6 months ago Essential hypertension   [place] [facility] at [facility], Nyoka Cowden, MD   10 months ago Prediabetes   [facility] at [facility], Nyoka Cowden, MD   1 year ago Essential hypertension   [place] [facility] at [facility], Nyoka Cowden, MD       Future Appointments             In 2 months Judithann Graves Nyoka Cowden, MD [facility] at [facility]            Passed - CBC within normal limits and completed in the last 12 months    WBC  Date Value Ref Range Status  07/22/2022 5.2 3.4 - 10.8 x10E3/uL Final   RBC  Date Value Ref Range Status   07/22/2022 4.08 3.77 - 5.28 x10E6/uL Final   Hemoglobin  Date Value Ref Range Status  07/22/2022 12.6 11.1 - 15.9 g/dL Final   Hematocrit  Date Value Ref Range Status  07/22/2022 37.6 34.0 - 46.6 % Final   MCHC  Date Value Ref Range Status  07/22/2022 33.5 31.5 - 35.7 g/dL Final   [facility]  Date Value Ref Range Status  07/22/2022 30.9 26.6 - 33.0 pg Final   MCV  Date Value Ref Range Status  07/22/2022 92 79 - 97 fL Final   No results found for: "PLTCOUNTKUC", "LABPLAT", "POCPLA" RDW  Date Value Ref Range Status  07/22/2022 12.5 11.7 - 15.4 % Final

## 2022-10-28 ENCOUNTER — Other Ambulatory Visit: Payer: Self-pay | Admitting: Internal Medicine

## 2022-10-28 DIAGNOSIS — I10 Essential (primary) hypertension: Secondary | ICD-10-CM

## 2022-11-11 ENCOUNTER — Encounter: Payer: Self-pay | Admitting: Internal Medicine

## 2022-11-11 ENCOUNTER — Telehealth: Payer: Self-pay

## 2022-11-11 ENCOUNTER — Other Ambulatory Visit: Payer: Self-pay | Admitting: Internal Medicine

## 2022-11-11 DIAGNOSIS — C259 Malignant neoplasm of pancreas, unspecified: Secondary | ICD-10-CM

## 2022-11-11 NOTE — Transitions of Care (Post Inpatient/ED Visit) (Signed)
11/11/2022  Name: Jenna Sheppard MRN: [id number] DOB: Jan 09, 1946  Today's TOC FU Call Status: Today's TOC FU Call Status:: Successful TOC FU Call Competed TOC FU Call Complete Date: 11/11/22  Transition Care Management Follow-up Telephone Call Date of Discharge: 11/10/22 Discharge Facility: Other ([facility]) Name of Other ([facility]) Discharge Facility: [facility] Type of Discharge: Emergency Department Reason for ED Visit: Other: (Cellulitis of leg, left) How have you been since you were released from the hospital?: Better Any questions or concerns?: No  Items Reviewed: Did you receive and understand the discharge instructions provided?: No Medications obtained,verified, and reconciled?: Yes (Medications Reviewed) Any new allergies since your discharge?: No Dietary orders reviewed?: NA Do you have support at home?: Yes  Medications Reviewed Today: Medications Reviewed Today     Reviewed by Merleen Nicely, LPN (Licensed Practical Nurse) on 11/11/22 at 1401  Med List Status: <None>   Medication Order Taking? Sig Documenting Provider Last Dose Status Informant  alendronate (FOSAMAX) 70 MG tablet [id number] Yes TAKE 1 TABLET BY MOUTH ONCE A WEEK. TAKE ON AN EMPTY STOMACH WITH A FULL GLASS OF WATER Reubin Milan, MD Taking Active   amLODipine (NORVASC) 5 MG tablet [id number] Yes Take 1 tablet by mouth once daily Reubin Milan, MD Taking Active   atorvastatin (LIPITOR) 10 MG tablet [id number] No Take 1 tablet (10 mg total) by mouth daily.  Patient not taking: Reported on 11/11/2022   Reubin Milan, MD Not Taking Active   calcium carbonate (OS-CAL - DOSED IN MG OF ELEMENTAL CALCIUM) 1250 (500 Ca) MG tablet [id number] Yes Take 1 tablet by mouth. [provider] Taking Active   co-enzyme Q-10 30 MG capsule [id number] No Take 30 mg by mouth 3 (three) times daily.  Patient not taking: Reported on 11/11/2022   [provider] Not Taking Active    diclofenac Sodium (VOLTAREN) 1 % GEL [id number] Yes Apply topically 4 (four) times daily. PRN [provider] Taking Active   ELIQUIS 5 MG TABS tablet [id number] Yes Take 5 mg by mouth 2 (two) times daily. [provider] Taking Active   estradiol (ESTRACE) 0.1 MG/GM vaginal cream [id number] Yes estradiol 0.01% (0.1 mg/gram) vaginal cream  APPLY AS DIRECTED BY TOPICAL ROUTE EVERY OTHER DAY THREE TIMES A WEEK USING FINGERTIP Reubin Milan, MD Taking Active   glucose blood test strip [id number] Yes OneTouch Ultra Test strips  TEST ONCE DAILY Reubin Milan, MD Taking Active   Krill Oil 1000 MG CAPS [id number] Yes Take by mouth. [provider] Taking Active   Lancets [facility] [place] PLUS [place]) MISC [id number] Yes OneTouch Delica Plus Lancet 33 gauge  USE ONE LANCET ONE TIME DAILY FOR CHECKING OF BLOOD GLUCOSE Reubin Milan, MD Taking Active   lisinopril (ZESTRIL) 40 MG tablet [id number] Yes Take 1 tablet by mouth once daily Reubin Milan, MD Taking Active   metFORMIN (GLUCOPHAGE) 500 MG tablet [id number] Yes Take 1 tablet by mouth once daily with breakfast Reubin Milan, MD Taking Active   Multiple Vitamins-Minerals (MULTIVITAMIN WITH MINERALS) tablet [id number] Yes Take 1 tablet by mouth daily. [provider] Taking Active             Home Care and Equipment/Supplies: Were Home Health Services Ordered?: NA Any new equipment or medical supplies ordered?: NA  Functional Questionnaire: Do you need assistance with bathing/showering or dressing?: No Do you need assistance with meal preparation?: No Do you need assistance with eating?: No Do you have  difficulty maintaining continence: No Do you need assistance with getting out of bed/getting out of a chair/moving?: No Do you have difficulty managing or taking your medications?: No  Follow up appointments reviewed: PCP Follow-up appointment confirmed?: Yes Date of PCP follow-up  appointment?: 11/20/22 Follow-up Provider: Dr [facility] Follow-up appointment confirmed?: (S) Yes (pt has stage 4 pancreatic cancer and taking chemo) Date of Specialist follow-up appointment?: 11/22/22 Follow-Up Specialty Provider:: Dr Timoteo Gaul - Do you need transportation to your follow-up appointment?: No Do you understand care options if your condition(s) worsen?: Yes-patient verbalized understanding    SIGNATURE  Woodfin Ganja LPN [facility] Nurse Health Advisor Direct Dial [phone]

## 2022-11-20 ENCOUNTER — Encounter: Payer: Self-pay | Admitting: Internal Medicine

## 2022-11-20 ENCOUNTER — Ambulatory Visit (INDEPENDENT_AMBULATORY_CARE_PROVIDER_SITE_OTHER): Payer: Medicare Other | Admitting: Internal Medicine

## 2022-11-20 VITALS — BP 128/70 | HR 95 | Ht 68.0 in | Wt 153.4 lb

## 2022-11-20 DIAGNOSIS — E782 Mixed hyperlipidemia: Secondary | ICD-10-CM

## 2022-11-20 DIAGNOSIS — C259 Malignant neoplasm of pancreas, unspecified: Secondary | ICD-10-CM

## 2022-11-20 DIAGNOSIS — L03116 Cellulitis of left lower limb: Secondary | ICD-10-CM | POA: Diagnosis not present

## 2022-11-20 DIAGNOSIS — E118 Type 2 diabetes mellitus with unspecified complications: Secondary | ICD-10-CM | POA: Diagnosis not present

## 2022-11-20 DIAGNOSIS — Z7984 Long term (current) use of oral hypoglycemic drugs: Secondary | ICD-10-CM

## 2022-11-20 NOTE — Progress Notes (Signed)
Date:  11/20/2022   Name:  Aashi Derrington   DOB:  12/08/1945   MRN:  [id number]   Chief Complaint: Cellulitis  Diabetes She presents for her follow-up diabetic visit. She has type 2 diabetes mellitus. Pertinent negatives for hypoglycemia include no headaches, nervousness/anxiousness or tremors. Pertinent negatives for diabetes include no chest pain, no fatigue, no polydipsia and no polyuria.  Cellulitis - started as a tick bite behind her left knee and was treated with Doxycycline. Labs for tick borne illnesses were all negative. The area has healed without any issues.  She had another insect bite on her right thigh 5 days ago but it is improving.   Pancreatic CA - recently diagnosed. She was given 4 mo survival without any treatment.   Undergoing chemotherapy every 2 weeks at [facility] and tolerating very well.  Recent CT showed no change in tumor size but decreased size of lymph nodes.    Lab Results  Component Value Date   NA 142 07/22/2022   K 4.4 07/22/2022   CO2 22 07/22/2022   GLUCOSE 142 (H) 07/22/2022   BUN 16 07/22/2022   CREATININE 0.79 07/22/2022   CALCIUM 9.6 07/22/2022   EGFR 77 07/22/2022   Lab Results  Component Value Date   CHOL 189 07/22/2022   HDL 80 07/22/2022   LDLCALC 88 07/22/2022   TRIG 124 07/22/2022   CHOLHDL 2.4 07/22/2022   Lab Results  Component Value Date   TSH 2.090 07/22/2022   Lab Results  Component Value Date   HGBA1C 7.0 (H) 07/22/2022   Lab Results  Component Value Date   WBC 5.2 07/22/2022   HGB 12.6 07/22/2022   HCT 37.6 07/22/2022   MCV 92 07/22/2022   PLT 173 07/22/2022   Lab Results  Component Value Date   ALT 21 07/22/2022   AST 19 07/22/2022   ALKPHOS 68 07/22/2022   BILITOT 0.6 07/22/2022   Lab Results  Component Value Date   VD25OH 44.6 03/19/2022     Review of Systems  Constitutional:  Negative for appetite change, fatigue, fever and unexpected weight change.  HENT:  Negative for tinnitus and trouble swallowing.    Eyes:  Negative for visual disturbance.  Respiratory:  Negative for cough, chest tightness and shortness of breath.   Cardiovascular:  Negative for chest pain, palpitations and leg swelling.  Gastrointestinal:  Negative for abdominal distention, abdominal pain, diarrhea, nausea and vomiting.  Endocrine: Negative for polydipsia and polyuria.  Genitourinary:  Negative for dysuria and hematuria.  Musculoskeletal:  Negative for arthralgias.  Skin:  Positive for color change (improving).  Neurological:  Negative for tremors, numbness and headaches.  Psychiatric/Behavioral:  Positive for dysphoric mood. Negative for sleep disturbance. The patient is not nervous/anxious.     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   Ovarian failure 07/04/2021   Type II diabetes mellitus with complication (HCC) 07/04/2021   Varicose veins of both lower extremities 07/04/2021   Essential hypertension 10/02/2016   Mixed hyperlipidemia 10/02/2016    Allergies  Allergen Reactions   Molds & Smuts Shortness Of Breath   Red Dye Rash   Sulfa Antibiotics Nausea Only    Past Surgical History:  Procedure Laterality Date   ABDOMINAL HYSTERECTOMY  01/21/2005  Still has 1 ovary   ABLATION ON ENDOMETRIOSIS  01/08/1990   BREAST BIOPSY Right    benign, 20 years ago   BREAST SURGERY  02/16/1985   2nd surgery - 04/29/1996   CHOLECYSTECTOMY  07/21/2000   DILATION AND CURETTAGE OF UTERUS  02/18/1989   laser eye  02/09/2010   WRIST ARTHROSCOPY Left 04/01/2017    Social History   Tobacco Use   Smoking status: Never   Smokeless tobacco: Never  Vaping Use   Vaping Use: Never used  Substance Use Topics   Alcohol use: Yes    Alcohol/week:  1.0 standard drink of alcohol    Types: 1 Glasses of wine per week    Comment: RARE   Drug use: Never     Medication list has been reviewed and updated.  Current Meds  Medication Sig   alendronate (FOSAMAX) 70 MG tablet TAKE 1 TABLET BY MOUTH ONCE A WEEK. TAKE ON AN EMPTY STOMACH WITH A FULL GLASS OF WATER   amLODipine (NORVASC) 5 MG tablet Take 1 tablet by mouth once daily   calcium carbonate (OS-CAL - DOSED IN MG OF ELEMENTAL CALCIUM) 1250 (500 Ca) MG tablet Take 1 tablet by mouth.   diclofenac Sodium (VOLTAREN) 1 % GEL Apply topically 4 (four) times daily. PRN   ELIQUIS 5 MG TABS tablet Take 5 mg by mouth 2 (two) times daily.   estradiol (ESTRACE) 0.1 MG/GM vaginal cream estradiol 0.01% (0.1 mg/gram) vaginal cream  APPLY AS DIRECTED BY TOPICAL ROUTE EVERY OTHER DAY THREE TIMES A WEEK USING FINGERTIP   glucose blood test strip OneTouch Ultra Test strips  TEST ONCE DAILY   Krill Oil 1000 MG CAPS Take by mouth.   Lancets (ONETOUCH DELICA PLUS LANCET33G) MISC OneTouch Delica Plus Lancet 33 gauge  USE ONE LANCET ONE TIME DAILY FOR CHECKING OF BLOOD GLUCOSE   lisinopril (ZESTRIL) 40 MG tablet Take 1 tablet by mouth once daily   metFORMIN (GLUCOPHAGE) 500 MG tablet Take 1 tablet by mouth once daily with breakfast   Multiple Vitamins-Minerals (MULTIVITAMIN WITH MINERALS) tablet Take 1 tablet by mouth daily.       11/20/2022   11:04 AM 07/22/2022    8:21 AM 03/19/2022    1:19 PM 07/04/2021    2:52 PM  GAD 7 : Generalized Anxiety Score  Nervous, Anxious, on Edge 0 0 1 1  Control/stop worrying 0 0 1 1  Worry too much - different things 0 0 1 1  Trouble relaxing 0 0 1 0  Restless 0 0 1 0  Easily annoyed or irritable 0 0 1 3  Afraid - awful might happen 0 0 0 0  Total GAD 7 Score 0 0 6 6  Anxiety Difficulty Not difficult at all Not difficult at all Not difficult at all        11/20/2022   11:04 AM 07/22/2022    8:20 AM 03/19/2022    1:19 PM  Depression screen PHQ 2/9  Decreased  Interest 2 0 0  Down, Depressed, Hopeless 2 0 0  PHQ - 2 Score 4 0 0  Altered sleeping 0 0 1  Tired, decreased energy 0 0 0  Change in appetite 0 0 0  Feeling bad or failure about yourself  0 0 0  Trouble concentrating 0 0 0  Moving slowly or fidgety/restless 0 0 0  Suicidal thoughts 0 0 0  PHQ-9 Score 4 0 1  Difficult doing work/chores Not difficult at all Not difficult at  all Not difficult at all    BP Readings from Last 3 Encounters:  11/20/22 128/70  07/22/22 134/80  03/19/22 128/68    Physical Exam Vitals and nursing note reviewed.  Constitutional:      General: She is not in acute distress.    Appearance: Normal appearance. She is well-developed.  HENT:     Head: Normocephalic and atraumatic.  Cardiovascular:     Rate and Rhythm: Normal rate and regular rhythm.  Pulmonary:     Effort: Pulmonary effort is normal. No respiratory distress.     Breath sounds: No wheezing or rhonchi.  Musculoskeletal:     Right lower leg: No edema.     Left lower leg: No edema.  Skin:    General: Skin is warm and dry.     Findings: No rash.          Comments: Area behind left knee normal appearance. Red macule with mild surrounding bruising - no drainage or tenderness  Neurological:     Mental Status: She is alert and oriented to person, place, and time.  Psychiatric:        Mood and Affect: Mood normal.        Behavior: Behavior normal.     Wt Readings from Last 3 Encounters:  11/20/22 153 lb 6.4 oz (69.6 kg)  07/22/22 151 lb (68.5 kg)  03/19/22 154 lb 3.2 oz (69.9 kg)    BP 128/70   Pulse 95   Ht 5\' 8"  (1.727 m)   Wt 153 lb 6.4 oz (69.6 kg)   SpO2 97%   BMI 23.32 kg/m   Assessment and Plan:  Problem List Items Addressed This Visit     Type II diabetes mellitus with complication (HCC)    Blood sugars stable without hypoglycemic symptoms or events. Currently being treated with metformin. She is not checking BS regularly and she is limiting her diet since the  pancreatic cancer dx. Lab Results  Component Value Date   HGBA1C 7.0 (H) 07/22/2022  Will continue metformin, diet ad lib; recheck A1C in 3-4 months Recommend eye exam       Pancreatic adenocarcinoma (HCC)    Tolerating chemotherapy without side effects other than hair loss She is coping fairly well - busy trying to get affairs in order for her husband.      Mixed hyperlipidemia (Chronic)    She started lipitor but then stopped it after cancer diagnosis.      Other Visit Diagnoses     Cellulitis of left lower extremity    -  Primary   This has resolved with Doxycycline. New lesion appears inflamed but not infected Tick borne illness testing all negative       No follow-ups on file.   Partially dictated using Dragon software, any errors are not intentional.  Reubin Milan, MD [facility] [place], [place]

## 2022-11-20 NOTE — Assessment & Plan Note (Addendum)
She started lipitor but then stopped it after cancer diagnosis.

## 2022-11-20 NOTE — Assessment & Plan Note (Addendum)
Blood sugars stable without hypoglycemic symptoms or events. Currently being treated with metformin. She is not checking BS regularly and she is limiting her diet since the pancreatic cancer dx. Lab Results  Component Value Date   HGBA1C 7.0 (H) 07/22/2022  Will continue metformin, diet ad lib; recheck A1C in 3-4 months Recommend eye exam

## 2022-11-20 NOTE — Assessment & Plan Note (Signed)
Tolerating chemotherapy without side effects other than hair loss She is coping fairly well - busy trying to get affairs in order for her husband.

## 2022-12-05 ENCOUNTER — Ambulatory Visit (INDEPENDENT_AMBULATORY_CARE_PROVIDER_SITE_OTHER): Payer: Medicare Other

## 2022-12-05 VITALS — Ht 68.0 in | Wt 153.0 lb

## 2022-12-05 DIAGNOSIS — Z0000 Encounter for general adult medical examination without abnormal findings: Secondary | ICD-10-CM | POA: Diagnosis not present

## 2022-12-05 NOTE — Patient Instructions (Signed)
Jenna Sheppard , Thank you for taking time to come for your Medicare Wellness Visit. I appreciate your ongoing commitment to your health goals. Please review the following plan we discussed and let me know if I can assist you in the future.   These are the goals we discussed:  Goals      DIET - EAT MORE FRUITS AND VEGETABLES     Patient Stated     Pt states she would like to remain healthy and active, add weight bearing exercises        This is a list of the screening recommended for you and due dates:  Health Maintenance  Topic Date Due   Eye exam for diabetics  Never done   COVID-19 Vaccine (7 - 2023-24 season) 04/24/2022   Colon Cancer Screening  02/25/2023   Flu Shot  01/09/2023   Hemoglobin A1C  01/20/2023   Yearly kidney function blood test for diabetes  07/23/2023   Yearly kidney health urinalysis for diabetes  07/23/2023   Complete foot exam   07/23/2023   Medicare Annual Wellness Visit  12/05/2023   DTaP/Tdap/Td vaccine (2 - Td or Tdap) 11/02/2031   Pneumonia Vaccine  Completed   DEXA scan (bone density measurement)  Completed   Hepatitis C Screening  Completed   Zoster (Shingles) Vaccine  Completed   HPV Vaccine  Aged Out    Advanced directives: no  Conditions/risks identified: none  Next appointment: Follow up in one year for your annual wellness visit 12/11/23 @ 8:15 am by phone   Preventive Care 65 Years and Older, Female Preventive care refers to lifestyle choices and visits with your health care provider that can promote health and wellness. What does preventive care include? A yearly physical exam. This is also called an annual well check. Dental exams once or twice a year. Routine eye exams. Ask your health care provider how often you should have your eyes checked. Personal lifestyle choices, including: Daily care of your teeth and gums. Regular physical activity. Eating a healthy diet. Avoiding tobacco and drug use. Limiting alcohol use. Practicing safe  sex. Taking low-dose aspirin every day. Taking vitamin and mineral supplements as recommended by your health care provider. What happens during an annual well check? The services and screenings done by your health care provider during your annual well check will depend on your age, overall health, lifestyle risk factors, and family history of disease. Counseling  Your health care provider may ask you questions about your: Alcohol use. Tobacco use. Drug use. Emotional well-being. Home and relationship well-being. Sexual activity. Eating habits. History of falls. Memory and ability to understand (cognition). Work and work Astronomer. Reproductive health. Screening  You may have the following tests or measurements: Height, weight, and BMI. Blood pressure. Lipid and cholesterol levels. These may be checked every 5 years, or more frequently if you are over 55 years old. Skin check. Lung cancer screening. You may have this screening every year starting at age 34 if you have a 30-pack-year history of smoking and currently smoke or have quit within the past 15 years. Fecal occult blood test (FOBT) of the stool. You may have this test every year starting at age 68. Flexible sigmoidoscopy or colonoscopy. You may have a sigmoidoscopy every 5 years or a colonoscopy every 10 years starting at age 73. Hepatitis C blood test. Hepatitis B blood test. Sexually transmitted disease (STD) testing. Diabetes screening. This is done by checking your blood sugar (glucose) after you have  not eaten for a while (fasting). You may have this done every 1-3 years. Bone density scan. This is done to screen for osteoporosis. You may have this done starting at age 21. Mammogram. This may be done every 1-2 years. Talk to your health care provider about how often you should have regular mammograms. Talk with your health care provider about your test results, treatment options, and if necessary, the need for more  tests. Vaccines  Your health care provider may recommend certain vaccines, such as: Influenza vaccine. This is recommended every year. Tetanus, diphtheria, and acellular pertussis (Tdap, Td) vaccine. You may need a Td booster every 10 years. Zoster vaccine. You may need this after age 31. Pneumococcal 13-valent conjugate (PCV13) vaccine. One dose is recommended after age 57. Pneumococcal polysaccharide (PPSV23) vaccine. One dose is recommended after age 64. Talk to your health care provider about which screenings and vaccines you need and how often you need them. This information is not intended to replace advice given to you by your health care provider. Make sure you discuss any questions you have with your health care provider. Document Released: 06/23/2015 Document Revised: 02/14/2016 Document Reviewed: 03/28/2015 Elsevier Interactive Patient Education  2017 [employer].  Fall Prevention in the Home Falls can cause injuries. They can happen to people of all ages. There are many things you can do to make your home safe and to help prevent falls. What can I do on the outside of my home? Regularly fix the edges of walkways and driveways and fix any cracks. Remove anything that might make you trip as you walk through a door, such as a raised step or threshold. Trim any bushes or trees on the path to your home. Use bright outdoor lighting. Clear any walking paths of anything that might make someone trip, such as rocks or tools. Regularly check to see if handrails are loose or broken. Make sure that both sides of any steps have handrails. Any raised decks and porches should have guardrails on the edges. Have any leaves, snow, or ice cleared regularly. Use sand or salt on walking paths during winter. Clean up any spills in your garage right away. This includes oil or grease spills. What can I do in the bathroom? Use night lights. Install grab bars by the toilet and in the tub and shower.  Do not use towel bars as grab bars. Use non-skid mats or decals in the tub or shower. If you need to sit down in the shower, use a plastic, non-slip stool. Keep the floor dry. Clean up any water that spills on the floor as soon as it happens. Remove soap buildup in the tub or shower regularly. Attach bath mats securely with double-sided non-slip rug tape. Do not have throw rugs and other things on the floor that can make you trip. What can I do in the bedroom? Use night lights. Make sure that you have a light by your bed that is easy to reach. Do not use any sheets or blankets that are too big for your bed. They should not hang down onto the floor. Have a firm chair that has side arms. You can use this for support while you get dressed. Do not have throw rugs and other things on the floor that can make you trip. What can I do in the kitchen? Clean up any spills right away. Avoid walking on wet floors. Keep items that you use a lot in easy-to-reach places. If you need to  reach something above you, use a strong step stool that has a grab bar. Keep electrical cords out of the way. Do not use floor polish or wax that makes floors slippery. If you must use wax, use non-skid floor wax. Do not have throw rugs and other things on the floor that can make you trip. What can I do with my stairs? Do not leave any items on the stairs. Make sure that there are handrails on both sides of the stairs and use them. Fix handrails that are broken or loose. Make sure that handrails are as long as the stairways. Check any carpeting to make sure that it is firmly attached to the stairs. Fix any carpet that is loose or worn. Avoid having throw rugs at the top or bottom of the stairs. If you do have throw rugs, attach them to the floor with carpet tape. Make sure that you have a light switch at the top of the stairs and the bottom of the stairs. If you do not have them, ask someone to add them for you. What else  can I do to help prevent falls? Wear shoes that: Do not have high heels. Have rubber bottoms. Are comfortable and fit you well. Are closed at the toe. Do not wear sandals. If you use a stepladder: Make sure that it is fully opened. Do not climb a closed stepladder. Make sure that both sides of the stepladder are locked into place. Ask someone to hold it for you, if possible. Clearly mark and make sure that you can see: Any grab bars or handrails. First and last steps. Where the edge of each step is. Use tools that help you move around (mobility aids) if they are needed. These include: Canes. Walkers. Scooters. Crutches. Turn on the lights when you go into a dark area. Replace any light bulbs as soon as they burn out. Set up your furniture so you have a clear path. Avoid moving your furniture around. If any of your floors are uneven, fix them. If there are any pets around you, be aware of where they are. Review your medicines with your doctor. Some medicines can make you feel dizzy. This can increase your chance of falling. Ask your doctor what other things that you can do to help prevent falls. This information is not intended to replace advice given to you by your health care provider. Make sure you discuss any questions you have with your health care provider. Document Released: 03/23/2009 Document Revised: 11/02/2015 Document Reviewed: 07/01/2014 Elsevier Interactive Patient Education  2017 [employer].

## 2022-12-05 NOTE — Progress Notes (Signed)
Subjective:   Jenna Sheppard is a 77 y.o. female who presents for Medicare Annual (Subsequent) preventive examination.  Visit Complete: Virtual  I connected with  Jenna Sheppard on 12/05/22 by a audio enabled telemedicine application and verified that I am speaking with the correct person using two identifiers.  Patient Location: Home  Provider Location: Office/Clinic  I discussed the limitations of evaluation and management by telemedicine. The patient expressed understanding and agreed to proceed.  Review of Systems     Cardiac Risk Factors include: advanced age (>43men, >6 women);diabetes mellitus;hypertension;dyslipidemia     Objective:    Today's Vitals   12/05/22 0900 12/05/22 0913  Weight:  153 lb (69.4 kg)  Height:  5\' 8"  (1.727 m)  PainSc: 0-No pain    Body mass index is 23.26 kg/m.     12/05/2022    9:05 AM 11/28/2021    8:25 AM  Advanced Directives  Does Patient Have a Medical Advance Directive? No Yes  Type of Special educational needs teacher of [place];Living will  Copy of Healthcare Power of Attorney in Chart?  No - copy requested  Would patient like information on creating a medical advance directive? No - Patient declined     Current Medications (verified) Outpatient Encounter Medications as of 12/05/2022  Medication Sig   alendronate (FOSAMAX) 70 MG tablet TAKE 1 TABLET BY MOUTH ONCE A WEEK. TAKE ON AN EMPTY STOMACH WITH A FULL GLASS OF WATER   amLODipine (NORVASC) 5 MG tablet Take 1 tablet by mouth once daily   diclofenac Sodium (VOLTAREN) 1 % GEL Apply topically 4 (four) times daily. PRN   ELIQUIS 5 MG TABS tablet Take 5 mg by mouth 2 (two) times daily.   estradiol (ESTRACE) 0.1 MG/GM vaginal cream estradiol 0.01% (0.1 mg/gram) vaginal cream  APPLY AS DIRECTED BY TOPICAL ROUTE EVERY OTHER DAY THREE TIMES A WEEK USING FINGERTIP   glucose blood test strip OneTouch Ultra Test strips  TEST ONCE DAILY   Krill Oil 1000 MG CAPS Take by mouth.   Lancets  (ONETOUCH DELICA PLUS LANCET33G) MISC OneTouch Delica Plus Lancet 33 gauge  USE ONE LANCET ONE TIME DAILY FOR CHECKING OF BLOOD GLUCOSE   lisinopril (ZESTRIL) 40 MG tablet Take 1 tablet by mouth once daily   metFORMIN (GLUCOPHAGE) 500 MG tablet Take 1 tablet by mouth once daily with breakfast   Multiple Vitamins-Minerals (MULTIVITAMIN WITH MINERALS) tablet Take 1 tablet by mouth daily.   calcium carbonate (OS-CAL - DOSED IN MG OF ELEMENTAL CALCIUM) 1250 (500 Ca) MG tablet Take 1 tablet by mouth.   No facility-administered encounter medications on file as of 12/05/2022.    Allergies (verified) Molds & smuts, Red dye, and Sulfa antibiotics   History: Past Medical History:  Diagnosis Date   Hypertension    Osteoarthritis    Prediabetes    Past Surgical History:  Procedure Laterality Date   ABDOMINAL HYSTERECTOMY  01/21/2005   Still has 1 ovary   ABLATION ON ENDOMETRIOSIS  01/08/1990   BREAST BIOPSY Right    benign, 20 years ago   BREAST SURGERY  02/16/1985   2nd surgery - 04/29/1996   CHOLECYSTECTOMY  07/21/2000   DILATION AND CURETTAGE OF UTERUS  02/18/1989   laser eye  02/09/2010   WRIST ARTHROSCOPY Left 04/01/2017   Family History  Problem Relation Age of Onset   Stroke Mother    Varicose Veins Mother    Stroke Father    Breast cancer Sister    Cancer  Sister    Breast cancer Paternal Grandmother 76   Parkinson's disease Brother    Diabetes Brother    Social History   Socioeconomic History   Marital status: Married    Spouse name: Ree Kida   Number of children: 1   Years of education: Not on file   Highest education level: Bachelor's degree (e.g., BA, AB, BS)  Occupational History   Not on file  Tobacco Use   Smoking status: Never   Smokeless tobacco: Never  Vaping Use   Vaping Use: Never used  Substance and Sexual Activity   Alcohol use: Yes    Alcohol/week: 1.0 standard drink of alcohol    Types: 1 Glasses of wine per week    Comment: RARE   Drug use: Never    Sexual activity: Not Currently    Birth control/protection: None  Other Topics Concern   Not on file  Social History Narrative   Not on file   Social Determinants of Health   Financial Resource Strain: Low Risk  (12/05/2022)   Overall Financial Resource Strain (CARDIA)    Difficulty of Paying Living Expenses: Not hard at all  Food Insecurity: No Food Insecurity (12/05/2022)   Hunger Vital Sign    Worried About Running Out of Food in the Last Year: Never true    Ran Out of Food in the Last Year: Never true  Transportation Needs: No Transportation Needs (12/05/2022)   PRAPARE - Administrator, Civil Service (Medical): No    Lack of Transportation (Non-Medical): No  Physical Activity: Insufficiently Active (12/05/2022)   Exercise Vital Sign    Days of Exercise per Week: 4 days    Minutes of Exercise per Session: 30 min  Stress: No Stress Concern Present (12/05/2022)   [employer] of Occupational Health - Occupational Stress Questionnaire    Feeling of Stress : Only a little  Recent Concern: Stress - Stress Concern Present (11/19/2022)   [employer] of Occupational Health - Occupational Stress Questionnaire    Feeling of Stress : To some extent  Social Connections: Moderately Isolated (12/05/2022)   Social Connection and Isolation Panel [NHANES]    Frequency of Communication with Friends and Family: More than three times a week    Frequency of Social Gatherings with Friends and Family: More than three times a week    Attends Religious Services: Never    Database administrator or Organizations: No    Attends Engineer, structural: Never    Marital Status: Married    Tobacco Counseling Counseling given: Not Answered   Clinical Intake:  Pre-visit preparation completed: Yes  Pain : No/denies pain Pain Score: 0-No pain     Nutritional Risks: None Diabetes: Yes CBG done?: No Did pt. bring in CBG monitor from home?: No  How often do you need  to have someone help you when you read instructions, pamphlets, or other written materials from your doctor or pharmacy?: 1 - Never  Interpreter Needed?: No  Information entered by :: Kennedy Bucker, LPN   Activities of Daily Living    12/05/2022    9:06 AM 12/04/2022    9:22 PM  In your present state of health, do you have any difficulty performing the following activities:  Hearing? 0 0  Vision? 0 0  Difficulty concentrating or making decisions? 0 0  Walking or climbing stairs? 0 0  Dressing or bathing? 0 0  Doing errands, shopping? 0 0  Preparing Food and eating ?  N N  Using the Toilet? N N  In the past six months, have you accidently leaked urine? N N  Do you have problems with loss of bowel control? N N  Managing your Medications? N N  Managing your Finances? N N  Housekeeping or managing your Housekeeping? N N    Patient Care Team: Reubin Milan, MD as PCP - General (Internal Medicine)  Indicate any recent Medical Services you may have received from other than [facility] providers in the past year (date may be approximate).     Assessment:   This is a routine wellness examination for [place].  Hearing/Vision screen Hearing Screening - Comments:: No aids Vision Screening - Comments:: No glasses  Dietary issues and exercise activities discussed:     Goals Addressed             This Visit's Progress    DIET - EAT MORE FRUITS AND VEGETABLES         Depression Screen    12/05/2022    9:03 AM 11/20/2022   11:04 AM 07/22/2022    8:20 AM 03/19/2022    1:19 PM 11/28/2021    8:24 AM 07/04/2021    2:52 PM  PHQ 2/9 Scores  PHQ - 2 Score 0 4 0 0 0 0  PHQ- 9 Score 0 4 0 1  0    Fall Risk    12/05/2022    9:06 AM 12/04/2022    9:22 PM 11/20/2022   11:04 AM 07/22/2022    8:21 AM 03/19/2022    1:19 PM  Fall Risk   Falls in the past year? 0 0 0 0 0  Number falls in past yr: 0  0 0 0  Injury with Fall? 0  0 0 0  Risk for fall due to : No Fall Risks  No Fall Risks  No Fall Risks No Fall Risks  Follow up Falls prevention discussed;Falls evaluation completed  Falls evaluation completed Falls evaluation completed Falls evaluation completed    MEDICARE RISK AT HOME:  Medicare Risk at Home - 12/05/22 0907     Any stairs in or around the home? Yes    If so, are there any without handrails? No    Home free of loose throw rugs in walkways, pet beds, electrical cords, etc? Yes    Adequate lighting in your home to reduce risk of falls? Yes    Life alert? No    Use of a cane, walker or w/c? No    Grab bars in the bathroom? Yes    Shower chair or bench in shower? No    Elevated toilet seat or a handicapped toilet? No             TIMED UP AND GO:  Was the test performed?  No    Cognitive Function:        12/05/2022    9:07 AM  6CIT Screen  What Year? 0 points  What month? 0 points  What time? 0 points  Count back from 20 0 points  Months in reverse 0 points  Repeat phrase 0 points  Total Score 0 points    Immunizations Immunization History  Administered Date(s) Administered   Influenza,trivalent, recombinat, inj, PF 02/28/2017, 02/27/2018   Influenza-Unspecified 03/08/2021, 03/04/2022   Moderna Covid-19 Vaccine Bivalent Booster 71yrs & up 10/16/2021   PFIZER Comirnaty(Gray Top)Covid-19 Tri-Sucrose Vaccine 07/10/2019, 07/31/2019, 09/12/2020, 02/23/2021   PFIZER(Purple Top)SARS-COV-2 Vaccination 02/27/2022   Pneumococcal Conjugate-13 04/04/2016  Pneumococcal Polysaccharide-23 06/11/2007, 10/04/2015   Tdap 11/01/2021   Tetanus Immune Globulin 04/04/2016   Zoster Recombinat (Shingrix) 11/19/2016, 01/18/2017    TDAP status: Up to date  Flu Vaccine status: Up to date  Pneumococcal vaccine status: Up to date  Covid-19 vaccine status: Completed vaccines  Qualifies for Shingles Vaccine? Yes   Zostavax completed No   Shingrix Completed?: Yes  Screening Tests Health Maintenance  Topic Date Due   OPHTHALMOLOGY EXAM  Never done    COVID-19 Vaccine (7 - 2023-24 season) 04/24/2022   Colonoscopy  02/25/2023   INFLUENZA VACCINE  01/09/2023   HEMOGLOBIN A1C  01/20/2023   Diabetic kidney evaluation - eGFR measurement  07/23/2023   Diabetic kidney evaluation - Urine ACR  07/23/2023   FOOT EXAM  07/23/2023   Medicare Annual Wellness (AWV)  12/05/2023   DTaP/Tdap/Td (2 - Td or Tdap) 11/02/2031   Pneumonia Vaccine 49+ Years old  Completed   DEXA SCAN  Completed   Hepatitis C Screening  Completed   Zoster Vaccines- Shingrix  Completed   HPV VACCINES  Aged Out    Health Maintenance  Health Maintenance Due  Topic Date Due   OPHTHALMOLOGY EXAM  Never done   COVID-19 Vaccine (7 - 2023-24 season) 04/24/2022   Colonoscopy  02/25/2023    Colorectal cancer screening: No longer required.   Mammogram status: No longer required due to age.  Bone Density status: Completed 09/04/21. Results reflect: Bone density results: OSTEOPOROSIS. Repeat every 2 years.  Lung Cancer Screening: (Low Dose CT Chest recommended if Age 23-80 years, 20 pack-year currently smoking OR have quit w/in 15years.) does not qualify.    Additional Screening:  Hepatitis C Screening: does qualify; Completed 07/04/21  Vision Screening: Recommended annual ophthalmology exams for early detection of glaucoma and other disorders of the eye. Is the patient up to date with their annual eye exam?  No  Who is the provider or what is the name of the office in which the patient attends annual eye exams? No one If pt is not established with a provider, would they like to be referred to a provider to establish care? No .   Dental Screening: Recommended annual dental exams for proper oral hygiene  Diabetic Foot Exam: Diabetic Foot Exam: Completed 07/22/22  Community Resource Referral / Chronic Care Management: CRR required this visit?  No   CCM required this visit?  No     Plan:     I have personally reviewed and noted the following in the patient's chart:    Medical and social history Use of alcohol, tobacco or illicit drugs  Current medications and supplements including opioid prescriptions. Patient is not currently taking opioid prescriptions. Functional ability and status Nutritional status Physical activity Advanced directives List of other physicians Hospitalizations, surgeries, and ER visits in previous 12 months Vitals Screenings to include cognitive, depression, and falls Referrals and appointments  In addition, I have reviewed and discussed with patient certain preventive protocols, quality metrics, and best practice recommendations. A written personalized care plan for preventive services as well as general preventive health recommendations were provided to patient.     Hal Hope, LPN   11/12/5407   After Visit Summary: (MyChart) Due to this being a telephonic visit, the after visit summary with patients personalized plan was offered to patient via MyChart   Nurse Notes: none

## 2022-12-18 ENCOUNTER — Other Ambulatory Visit: Payer: Self-pay | Admitting: Internal Medicine

## 2023-01-14 ENCOUNTER — Encounter: Payer: Self-pay | Admitting: Family Medicine

## 2023-01-14 ENCOUNTER — Ambulatory Visit (INDEPENDENT_AMBULATORY_CARE_PROVIDER_SITE_OTHER): Payer: Medicare Other | Admitting: Family Medicine

## 2023-01-14 ENCOUNTER — Ambulatory Visit: Payer: Self-pay

## 2023-01-14 VITALS — BP 128/88 | HR 80 | Ht 68.0 in | Wt 158.0 lb

## 2023-01-14 DIAGNOSIS — M62838 Other muscle spasm: Secondary | ICD-10-CM | POA: Diagnosis not present

## 2023-01-14 MED ORDER — BACLOFEN 5 MG PO TABS
1.0000 | ORAL_TABLET | Freq: Three times a day (TID) | ORAL | 0 refills | Status: DC | PRN
Start: 2023-01-14 — End: 2024-01-02

## 2023-01-14 MED ORDER — DICLOFENAC POTASSIUM 50 MG PO TABS
50.0000 mg | ORAL_TABLET | Freq: Two times a day (BID) | ORAL | 0 refills | Status: DC | PRN
Start: 2023-01-14 — End: 2024-01-02

## 2023-01-14 MED ORDER — PREDNISONE 20 MG PO TABS
40.0000 mg | ORAL_TABLET | Freq: Every day | ORAL | 0 refills | Status: AC
Start: 2023-01-14 — End: 2023-01-19

## 2023-01-14 NOTE — Progress Notes (Signed)
Primary Care / Sports Medicine Office Visit  Patient Information:  Patient ID: Jenna Sheppard, female DOB: Sep 27, 1945 Age: 77 y.o. MRN: [id number]   Jenna Sheppard is a pleasant 77 y.o. female presenting with the following:  Chief Complaint  Patient presents with   Neck Pain    1 week    Vitals:   01/14/23 0925  BP: 128/88  Pulse: 80  SpO2: 98%   Vitals:   01/14/23 0925  Weight: 158 lb (71.7 kg)  Height: 5\' 8"  (1.727 m)   Body mass index is 24.02 kg/m.  No results found.   Independent interpretation of notes and tests performed by another provider:   None  Procedures performed:   None  Pertinent History, Exam, Impression, and Recommendations:   Retina was seen today for neck pain.  Cervical paraspinal muscle spasm Assessment & Plan: Left lateral neck pain x 1 week, atraumatic onset, cannot recall any change in baseline activity, radiation to the left shoulder, no contralateral symptoms, no weakness of the upper extremities, no paresthesias.  Denies any HEENT symptoms.  Examination with significantly limited cervical range of motion due to pain, significant paraspinal muscular spasm left greater than right throughout the trapezius, levator scapula, and rhomboid regions, shoulder examination benign, bilateral tympanic membranes and canals benign, no tragal tenderness, no lymphadenopathy, equivocal/negative Spurling's, limited by acuity of symptoms.  Patient's clinical history and findings are most consistent with significant cervical paraspinal muscular spasm.  Plan as follows: - Patient will reach out to her oncologist and if cleared start the following regimen: Baclofen (muscle relaxer) 5-10 mg up to three times a day (side effect can be drowsiness) Diclofenac (NSAID) 50 mg twice daily as-needed (take with food) Prednisone (steroid) 40 mg daily x 5 days (if symptoms fail to improve after 2-3 days of the above medications - Will take prednisone if not  improving - Use moist heat for additional muscle pain control and find supportive seating to offload the neck muscles - Work on gentle neck range of motion and can start exercises if symptoms allow - Contact us for any questions / concerns -Recalcitrant symptoms to be addressed with cervical spine x-ray, formal PT, consideration of trigger point injections  Orders: -     Diclofenac Potassium; Take 1 tablet (50 mg total) by mouth 2 (two) times daily as needed.  Dispense: 30 tablet; Refill: 0 -     predniSONE; Take 2 tablets (40 mg total) by mouth daily with breakfast for 5 days.  Dispense: 10 tablet; Refill: 0 -     Baclofen; Take 1-2 tablets (5-10 mg total) by mouth 3 (three) times daily as needed.  Dispense: 30 tablet; Refill: 0     Orders & Medications Meds ordered this encounter  Medications   diclofenac (CATAFLAM) 50 MG tablet    Sig: Take 1 tablet (50 mg total) by mouth 2 (two) times daily as needed.    Dispense:  30 tablet    Refill:  0   predniSONE (DELTASONE) 20 MG tablet    Sig: Take 2 tablets (40 mg total) by mouth daily with breakfast for 5 days.    Dispense:  10 tablet    Refill:  0   Baclofen 5 MG TABS    Sig: Take 1-2 tablets (5-10 mg total) by mouth 3 (three) times daily as needed.    Dispense:  30 tablet    Refill:  0   No orders of the defined types were placed in this encounter.  No follow-ups on file.     Jerrol Banana, MD, [facility]   Primary Care Sports Medicine Primary Care and Sports Medicine at [facility]

## 2023-01-14 NOTE — Telephone Encounter (Signed)
     Chief Complaint: Neck pain and into left shoulder Symptoms: Above, 8/10. Hurts to stand up. Frequency: 1 week Pertinent Negatives: Patient denies weakness Disposition: [] ED /[] Urgent Care (no appt availability in office) / [x] Appointment(In office/virtual)/ []  [place] [facility]/ [] Home Care/ [] Refused Recommended Disposition /[] [place] [facility]/ []  Follow-up with PCP Additional Notes: Agrees with appointment today.  Reason for Disposition  [1] SEVERE neck pain (e.g., excruciating, unable to do any normal activities) AND [2] not improved after 2 hours of pain medicine  Answer Assessment - Initial Assessment Questions 1. ONSET: "When did the pain begin?"      1 week ago 2. LOCATION: "Where does it hurt?"      Back neck and left shoulder 3. PATTERN "Does the pain come and go, or has it been constant since it started?"      Constant 4. SEVERITY: "How bad is the pain?"  (Scale 1-10; or mild, moderate, severe)   - NO PAIN (0): no pain or only slight stiffness    - MILD (1-3): doesn't interfere with normal activities    - MODERATE (4-7): interferes with normal activities or awakens from sleep    - SEVERE (8-10):  excruciating pain, unable to do any normal activities      8 5. RADIATION: "Does the pain go anywhere else, shoot into your arms?"     Left shoulder 6. CORD SYMPTOMS: "Any weakness or numbness of the arms or legs?"     No 7. CAUSE: "What do you think is causing the neck pain?"     Unsure 8. NECK OVERUSE: "Any recent activities that involved turning or twisting the neck?"     No 9. OTHER SYMPTOMS: "Do you have any other symptoms?" (e.g., headache, fever, chest pain, difficulty breathing, neck swelling)     No 10. PREGNANCY: "Is there any chance you are pregnant?" "When was your last menstrual period?"       No  Protocols used: Neck Pain or Stiffness-A-AH

## 2023-01-14 NOTE — Patient Instructions (Signed)
-   Check in with your oncologist about the use of the following medications: Baclofen (muscle relaxer) 5-10 mg up to three times a day (side effect can be drowsiness) Diclofenac (NSAID) 50 mg twice daily as-needed (take with food) Prednisone (steroid) 40 mg daily x 5 days (if symptoms fail to improve after 2-3 days of the above medications  - If approved, start the medications listed above (baclofen and diclofenac) and take prednisone if not improving - Use moist heat for additional muscle pain control and find supportive seating to offload the neck muscles - Work on gentle neck range of motion and can start exercises if symptoms allow - Contact us for any questions / concerns

## 2023-01-14 NOTE — Assessment & Plan Note (Signed)
Left lateral neck pain x 1 week, atraumatic onset, cannot recall any change in baseline activity, radiation to the left shoulder, no contralateral symptoms, no weakness of the upper extremities, no paresthesias.  Denies any HEENT symptoms.  Examination with significantly limited cervical range of motion due to pain, significant paraspinal muscular spasm left greater than right throughout the trapezius, levator scapula, and rhomboid regions, shoulder examination benign, bilateral tympanic membranes and canals benign, no tragal tenderness, no lymphadenopathy, equivocal/negative Spurling's, limited by acuity of symptoms.  Patient's clinical history and findings are most consistent with significant cervical paraspinal muscular spasm.  Plan as follows: - Patient will reach out to her oncologist and if cleared start the following regimen: Baclofen (muscle relaxer) 5-10 mg up to three times a day (side effect can be drowsiness) Diclofenac (NSAID) 50 mg twice daily as-needed (take with food) Prednisone (steroid) 40 mg daily x 5 days (if symptoms fail to improve after 2-3 days of the above medications - Will take prednisone if not improving - Use moist heat for additional muscle pain control and find supportive seating to offload the neck muscles - Work on gentle neck range of motion and can start exercises if symptoms allow - Contact us for any questions / concerns -Recalcitrant symptoms to be addressed with cervical spine x-ray, formal PT, consideration of trigger point injections

## 2023-01-27 ENCOUNTER — Other Ambulatory Visit: Payer: Self-pay | Admitting: Internal Medicine

## 2023-01-27 DIAGNOSIS — I10 Essential (primary) hypertension: Secondary | ICD-10-CM

## 2023-01-28 NOTE — Telephone Encounter (Signed)
Requested Prescriptions  Pending Prescriptions Disp Refills   lisinopril (ZESTRIL) 40 MG tablet [Pharmacy Med Name: Lisinopril 40 MG Oral Tablet] 90 tablet 1    Sig: Take 1 tablet by mouth once daily     Cardiovascular:  ACE Inhibitors Failed - 01/27/2023  8:44 AM      Failed - Cr in normal range and within 180 days    Creatinine, Ser  Date Value Ref Range Status  07/22/2022 0.79 0.57 - 1.00 mg/dL Final         Failed - K in normal range and within 180 days    Potassium  Date Value Ref Range Status  07/22/2022 4.4 3.5 - 5.2 mmol/L Final         Passed - Patient is not pregnant      Passed - Last BP in normal range    BP Readings from Last 1 Encounters:  01/14/23 128/88         Passed - Valid encounter within last 6 months    Recent Outpatient Visits           2 weeks ago Cervical paraspinal muscle spasm   [place] [facility] at [facility] Royalty, Ocie Bob, MD   2 months ago Cellulitis of left lower extremity   [place] [facility] at [facility], Nyoka Cowden, MD   6 months ago Essential hypertension   [place] [facility] at [facility], Nyoka Cowden, MD   10 months ago Essential hypertension   [place] [facility] at [facility], Nyoka Cowden, MD   1 year ago Prediabetes   [facility] at [facility], Nyoka Cowden, MD

## 2023-02-23 ENCOUNTER — Other Ambulatory Visit: Payer: Self-pay | Admitting: Internal Medicine

## 2023-02-23 DIAGNOSIS — I10 Essential (primary) hypertension: Secondary | ICD-10-CM

## 2023-03-19 ENCOUNTER — Other Ambulatory Visit: Payer: Self-pay | Admitting: Internal Medicine

## 2023-03-19 DIAGNOSIS — M810 Age-related osteoporosis without current pathological fracture: Secondary | ICD-10-CM

## 2023-05-09 LAB — CBC AND DIFFERENTIAL
HCT: 32 — AB (ref 36–46)
Hemoglobin: 11.2 — AB (ref 12.0–16.0)
Platelets: 117 10*3/uL — AB (ref 150–400)
WBC: 5.4

## 2023-05-09 LAB — BASIC METABOLIC PANEL
BUN: 16 (ref 4–21)
CO2: 28 — AB (ref 13–22)
Chloride: 110 — AB (ref 99–108)
Creatinine: 0.7 (ref 0.5–1.1)
Glucose: 178
Potassium: 3.8 meq/L (ref 3.5–5.1)
Sodium: 142 (ref 137–147)

## 2023-05-09 LAB — COMPREHENSIVE METABOLIC PANEL: eGFR: 90

## 2023-05-19 ENCOUNTER — Other Ambulatory Visit: Payer: Self-pay | Admitting: Internal Medicine

## 2023-05-19 DIAGNOSIS — I10 Essential (primary) hypertension: Secondary | ICD-10-CM

## 2023-05-20 NOTE — Telephone Encounter (Signed)
Requested Prescriptions  Pending Prescriptions Disp Refills   amLODipine (NORVASC) 5 MG tablet [Pharmacy Med Name: amLODIPine Besylate 5 MG Oral Tablet] 90 tablet 0    Sig: Take 1 tablet by mouth once daily     Cardiovascular: Calcium Channel Blockers 2 Passed - 05/19/2023 11:52 AM      Passed - Last BP in normal range    BP Readings from Last 1 Encounters:  01/14/23 128/88         Passed - Last Heart Rate in normal range    Pulse Readings from Last 1 Encounters:  01/14/23 80         Passed - Valid encounter within last 6 months    Recent Outpatient Visits           4 months ago Cervical paraspinal muscle spasm   [place] [facility] at [facility], Ocie Bob, MD   6 months ago Cellulitis of left lower extremity   [place] [facility] at [facility], Nyoka Cowden, MD   10 months ago Essential hypertension   [place] [facility] at [facility], Nyoka Cowden, MD   1 year ago Essential hypertension   [place] [facility] at [facility], Nyoka Cowden, MD   1 year ago Prediabetes   [facility] Medicine at [facility], Nyoka Cowden, MD

## 2023-06-12 ENCOUNTER — Other Ambulatory Visit: Payer: Self-pay | Admitting: Internal Medicine

## 2023-06-16 NOTE — Telephone Encounter (Signed)
 Requested medication (s) are due for refill today:yes  Requested medication (s) are on the active medication list: yes  Last refill:  12/18/22 #90 1 RF   Future visit scheduled: yes  Notes to clinic:  needs appt   Requested Prescriptions  Pending Prescriptions Disp Refills   metFORMIN  (GLUCOPHAGE ) 500 MG tablet [Pharmacy Med Name: metFORMIN  HCl 500 MG Oral Tablet] 90 tablet 0    Sig: Take 1 tablet by mouth once daily with breakfast     Endocrinology:  Diabetes - Biguanides Failed - 06/16/2023 10:36 AM      Failed - HBA1C is between 0 and 7.9 and within 180 days    Hgb A1c MFr Bld  Date Value Ref Range Status  07/22/2022 7.0 (H) 4.8 - 5.6 % Final    Comment:             Prediabetes: 5.7 - 6.4          Diabetes: >6.4          Glycemic control for adults with diabetes: <7.0          Failed - B12 Level in normal range and within 720 days    No results found for: VITAMINB12       Failed - Valid encounter within last 6 months    Recent Outpatient Visits           5 months ago Cervical paraspinal muscle spasm   [place] [facility] at [facility], Selinda PARAS, MD   6 months ago Cellulitis of left lower extremity   [place] [facility] at [facility], Leita DEL, MD   10 months ago Essential hypertension   [place] [facility] at [facility], Leita DEL, MD   1 year ago Essential hypertension    Primary Care & Sports Medicine at [facility], Leita DEL, MD   1 year ago Prediabetes   [facility] at [facility] Leita DEL, MD              Passed - Cr in normal range and within 360 days    Creatinine, Ser  Date Value Ref Range Status  07/22/2022 0.79 0.57 - 1.00 mg/dL Final         Passed - eGFR in normal range and within 360 days    eGFR  Date Value Ref Range Status  07/22/2022 77 >59 mL/min/1.73 Final          Passed - CBC within normal limits and completed in the last 12 months    WBC  Date Value Ref Range Status  07/22/2022 5.2 3.4 - 10.8 x10E3/uL Final   RBC  Date Value Ref Range Status  07/22/2022 4.08 3.77 - 5.28 x10E6/uL Final   Hemoglobin  Date Value Ref Range Status  07/22/2022 12.6 11.1 - 15.9 g/dL Final   Hematocrit  Date Value Ref Range Status  07/22/2022 37.6 34.0 - 46.6 % Final   MCHC  Date Value Ref Range Status  07/22/2022 33.5 31.5 - 35.7 g/dL Final   [facility]  Date Value Ref Range Status  07/22/2022 30.9 26.6 - 33.0 pg Final   MCV  Date Value Ref Range Status  07/22/2022 92 79 - 97 fL Final   No results found for: PLTCOUNTKUC, LABPLAT, POCPLA RDW  Date Value Ref Range Status  07/22/2022 12.5 11.7 - 15.4 % Final

## 2023-07-19 ENCOUNTER — Other Ambulatory Visit: Payer: Self-pay | Admitting: Internal Medicine

## 2023-07-19 DIAGNOSIS — I10 Essential (primary) hypertension: Secondary | ICD-10-CM

## 2023-07-21 NOTE — Telephone Encounter (Signed)
 Courtesy refill. Requested Prescriptions  Pending Prescriptions Disp Refills   lisinopril  (ZESTRIL ) 40 MG tablet [Pharmacy Med Name: Lisinopril  40 MG Oral Tablet] 30 tablet 0    Sig: Take 1 tablet by mouth once daily     Cardiovascular:  ACE Inhibitors Failed - 07/21/2023 12:46 PM      Failed - Cr in normal range and within 180 days    Creatinine, Ser  Date Value Ref Range Status  07/22/2022 0.79 0.57 - 1.00 mg/dL Final         Failed - K in normal range and within 180 days    Potassium  Date Value Ref Range Status  07/22/2022 4.4 3.5 - 5.2 mmol/L Final         Failed - Valid encounter within last 6 months    Recent Outpatient Visits           6 months ago Cervical paraspinal muscle spasm   [place] [facility] at [facility] Ledger, Dessie Flow, MD   8 months ago Cellulitis of left lower extremity   [place] [facility] at [facility], Chales [place], MD   12 months ago Essential hypertension   [place] [facility] at [facility], Chales [place], MD   1 year ago Essential hypertension   [place] [facility] Medicine at [facility], Chales [place], MD   1 year ago Prediabetes   [facility] at [facility], Chales [place], MD              Passed - Patient is not pregnant      Passed - Last BP in normal range    BP Readings from Last 1 Encounters:  01/14/23 128/88

## 2023-08-12 ENCOUNTER — Encounter: Payer: Self-pay | Admitting: Internal Medicine

## 2023-08-12 ENCOUNTER — Other Ambulatory Visit: Payer: Self-pay | Admitting: Internal Medicine

## 2023-08-12 ENCOUNTER — Other Ambulatory Visit (HOSPITAL_COMMUNITY)
Admission: RE | Admit: 2023-08-12 | Discharge: 2023-08-12 | Disposition: A | Discharge status: Home / Self Care | Source: Ambulatory Visit | Attending: Internal Medicine | Admitting: Internal Medicine

## 2023-08-12 ENCOUNTER — Ambulatory Visit: Admitting: Internal Medicine

## 2023-08-12 VITALS — BP 126/78 | HR 113 | Ht 68.0 in

## 2023-08-12 DIAGNOSIS — N760 Acute vaginitis: Secondary | ICD-10-CM | POA: Insufficient documentation

## 2023-08-12 DIAGNOSIS — Z7984 Long term (current) use of oral hypoglycemic drugs: Secondary | ICD-10-CM

## 2023-08-12 DIAGNOSIS — C259 Malignant neoplasm of pancreas, unspecified: Secondary | ICD-10-CM | POA: Diagnosis not present

## 2023-08-12 DIAGNOSIS — E118 Type 2 diabetes mellitus with unspecified complications: Secondary | ICD-10-CM

## 2023-08-12 DIAGNOSIS — N390 Urinary tract infection, site not specified: Secondary | ICD-10-CM

## 2023-08-12 LAB — POCT URINALYSIS DIPSTICK
Glucose, UA: NEGATIVE
Ketones, UA: POSITIVE
Nitrite, UA: NEGATIVE
Protein, UA: POSITIVE — AB
Spec Grav, UA: >=1.03 — AB (ref 1.010–1.025)
Urobilinogen, UA: 0.2 U/dL
pH, UA: 5 (ref 5.0–8.0)

## 2023-08-12 MED ORDER — NITROFURANTOIN MONOHYD MACRO 100 MG PO CAPS
100.0000 mg | ORAL_CAPSULE | Freq: Two times a day (BID) | ORAL | 0 refills | Status: AC
Start: 2023-08-12 — End: 2023-08-19

## 2023-08-12 MED ORDER — FLUCONAZOLE 100 MG PO TABS
100.0000 mg | ORAL_TABLET | Freq: Once | ORAL | 0 refills | Status: AC
Start: 2023-08-12 — End: 2023-08-12

## 2023-08-12 NOTE — Progress Notes (Signed)
 Date:  08/12/2023   Name:  Jenna Sheppard   DOB:  June 15, 1945   MRN:  [id number]   Chief Complaint: Urinary Tract Infection (Patient said she still has symptoms from her last visit at the urgent care) and Vaginal Itching  Urinary Tract Infection  This is a recurrent problem. The quality of the pain is described as burning. The pain is mild. There has been no fever. Associated symptoms include frequency and urgency. Pertinent negatives include no chills or flank pain. She has tried antibiotics for the symptoms.  Vaginal Itching Associated symptoms include frequency and urgency. Pertinent negatives include no chills or flank pain.    Review of Systems  Constitutional:  Negative for chills.  Genitourinary:  Positive for frequency and urgency. Negative for flank pain.     Lab Results  Component Value Date   NA 142 05/09/2023   K 3.8 05/09/2023   CO2 28 (A) 05/09/2023   GLUCOSE 142 (H) 07/22/2022   BUN 16 05/09/2023   CREATININE 0.7 05/09/2023   CALCIUM 9.6 07/22/2022   EGFR 90 05/09/2023   Lab Results  Component Value Date   CHOL 189 07/22/2022   HDL 80 07/22/2022   LDLCALC 88 07/22/2022   TRIG 124 07/22/2022   CHOLHDL 2.4 07/22/2022   Lab Results  Component Value Date   TSH 2.090 07/22/2022   Lab Results  Component Value Date   HGBA1C 7.0 (H) 07/22/2022   Lab Results  Component Value Date   WBC 5.4 05/09/2023   HGB 11.2 (A) 05/09/2023   HCT 32 (A) 05/09/2023   MCV 92 07/22/2022   PLT 117 (A) 05/09/2023   Lab Results  Component Value Date   ALT 21 07/22/2022   AST 19 07/22/2022   ALKPHOS 68 07/22/2022   BILITOT 0.6 07/22/2022   Lab Results  Component Value Date   VD25OH 44.6 03/19/2022     Patient Active Problem List   Diagnosis Date Noted   Recurrent UTI (urinary tract infection) 08/12/2023   Cervical paraspinal muscle spasm 01/14/2023   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   Neoplasm of uncertain behavior  of skin 11/22/2021   Age-related osteoporosis without current pathological fracture 09/04/2021   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   Ovarian failure 07/04/2021   Type II diabetes mellitus with complication (HCC) 07/04/2021   Varicose veins of both lower extremities 07/04/2021   Essential hypertension 10/02/2016   Mixed hyperlipidemia 10/02/2016    Allergies  Allergen Reactions   Molds & Smuts Shortness Of Breath   Red Dye #40 (Allura Red) Rash   Sulfa Antibiotics Nausea Only    Past Surgical History:  Procedure Laterality Date   ABDOMINAL HYSTERECTOMY  01/21/2005   Still has 1 ovary   ABLATION ON ENDOMETRIOSIS  01/08/1990   BREAST BIOPSY Right    benign, 20 years ago   BREAST SURGERY  02/16/1985   2nd surgery - 04/29/1996   CHOLECYSTECTOMY  07/21/2000   DILATION AND CURETTAGE OF UTERUS  02/18/1989   laser eye  02/09/2010   WRIST ARTHROSCOPY Left 04/01/2017    Social History   Tobacco Use   Smoking status: Never   Smokeless tobacco: Never  Vaping Use   Vaping status: Never Used  Substance Use Topics   Alcohol use: Yes    Alcohol/week: 1.0 standard drink of alcohol    Types: 1 Glasses of wine per week    Comment: RARE   Drug use: Never  Medication list has been reviewed and updated.  Current Meds  Medication Sig   alendronate (FOSAMAX) 70 MG tablet TAKE 1 TABLET BY MOUTH ONCE A WEEK ON AN EMPTY STOMACH WITH A GLASS OF WATER   amLODipine (NORVASC) 5 MG tablet Take 1 tablet by mouth once daily   Baclofen 5 MG TABS Take 1-2 tablets (5-10 mg total) by mouth 3 (three) times daily as needed.   calcium carbonate (OS-CAL - DOSED IN MG OF ELEMENTAL CALCIUM) 1250 (500 Ca) MG tablet Take 1 tablet by mouth.   diclofenac (CATAFLAM) 50 MG tablet Take 1 tablet (50 mg total) by mouth 2 (two) times daily as needed.   diclofenac Sodium (VOLTAREN) 1 % GEL Apply topically 4 (four) times daily. PRN   ELIQUIS 5 MG TABS tablet Take 5 mg by mouth 2 (two) times  daily.   estradiol (ESTRACE) 0.1 MG/GM vaginal cream estradiol 0.01% (0.1 mg/gram) vaginal cream  APPLY AS DIRECTED BY TOPICAL ROUTE EVERY OTHER DAY THREE TIMES A WEEK USING FINGERTIP   fluconazole (DIFLUCAN) 100 MG tablet Take 1 tablet (100 mg total) by mouth once for 1 dose.   glucose blood test strip OneTouch Ultra Test strips  TEST ONCE DAILY   Krill Oil 1000 MG CAPS Take by mouth.   Lancets (ONETOUCH DELICA PLUS LANCET33G) MISC OneTouch Delica Plus Lancet 33 gauge  USE ONE LANCET ONE TIME DAILY FOR CHECKING OF BLOOD GLUCOSE   lisinopril (ZESTRIL) 40 MG tablet Take 1 tablet by mouth once daily   metFORMIN (GLUCOPHAGE) 500 MG tablet Take 1 tablet by mouth once daily with breakfast   Multiple Vitamins-Minerals (MULTIVITAMIN WITH MINERALS) tablet Take 1 tablet by mouth daily.   nitrofurantoin, macrocrystal-monohydrate, (MACROBID) 100 MG capsule Take 1 capsule (100 mg total) by mouth 2 (two) times daily for 7 days.       08/12/2023   10:26 AM 08/12/2023   10:25 AM 11/20/2022   11:04 AM 07/22/2022    8:21 AM  GAD 7 : Generalized Anxiety Score  Nervous, Anxious, on Edge 1 0 0 0  Control/stop worrying 1 0 0 0  Worry too much - different things  0 0 0  Trouble relaxing  0 0 0  Restless  0 0 0  Easily annoyed or irritable  0 0 0  Afraid - awful might happen  0 0 0  Total GAD 7 Score  0 0 0  Anxiety Difficulty  Not difficult at all Not difficult at all Not difficult at all       08/12/2023   10:25 AM 12/05/2022    9:03 AM 11/20/2022   11:04 AM  Depression screen PHQ 2/9  Decreased Interest 0 0 2  Down, Depressed, Hopeless 0 0 2  PHQ - 2 Score 0 0 4  Altered sleeping  0 0  Tired, decreased energy  0 0  Change in appetite  0 0  Feeling bad or failure about yourself   0 0  Trouble concentrating  0 0  Moving slowly or fidgety/restless  0 0  Suicidal thoughts  0 0  PHQ-9 Score  0 4  Difficult doing work/chores  Not difficult at all Not difficult at all    BP Readings from Last 3  Encounters:  08/12/23 126/78  01/14/23 128/88  11/20/22 128/70    Physical Exam Constitutional:      Appearance: Normal appearance.  Cardiovascular:     Rate and Rhythm: Normal rate and regular rhythm.  Pulmonary:  Effort: Pulmonary effort is normal.     Breath sounds: No wheezing or rhonchi.  Abdominal:     Palpations: Abdomen is soft.     Tenderness: There is no abdominal tenderness. There is no right CVA tenderness or left CVA tenderness.  Skin:    General: Skin is warm and dry.  Neurological:     Mental Status: She is alert.  Psychiatric:        Mood and Affect: Mood normal.     Wt Readings from Last 3 Encounters:  01/14/23 158 lb (71.7 kg)  12/05/22 153 lb (69.4 kg)  11/20/22 153 lb 6.4 oz (69.6 kg)    BP 126/78   Pulse (!) 113   Ht 5\' 8"  (1.727 m)   SpO2 98%   BMI 24.02 kg/m   Assessment and Plan:  Problem List Items Addressed This Visit       Unprioritized   Type II diabetes mellitus with complication (HCC)   Blood sugars stable without hypoglycemic symptoms or events. Currently managed with MTF. Changes made last visit are none. Lab Results  Component Value Date   HGBA1C 7.0 (H) 07/22/2022         Pancreatic adenocarcinoma (HCC)   Relevant Medications   nitrofurantoin, macrocrystal-monohydrate, (MACROBID) 100 MG capsule   fluconazole (DIFLUCAN) 100 MG tablet   Recurrent UTI (urinary tract infection) - Primary   CT abd/pelvis 03/2023 KIDNEYS/URETERS: Symmetric renal enhancement.  No hydronephrosis.  No solid renal mass. Unchanged bilateral renal cysts and subcentimeter scattered hypodensities too small to characterize on CT.  Has had three infections since December. Klebsiella, E Coli and enterococcus.  Treated with Keflex, Cipro, Macrobid, Monurol and most recently Augmentin Can not see her Urologist until August Will treat with macrobid and get culture      Relevant Medications   nitrofurantoin, macrocrystal-monohydrate, (MACROBID) 100  MG capsule   fluconazole (DIFLUCAN) 100 MG tablet   Other Relevant Orders   Ambulatory referral to Urology   POCT urinalysis dipstick (Completed)   Urine Culture   Other Visit Diagnoses       Acute vaginitis       suspect candida due to repeated antibiotic courses Aptima swab obtained one dose of Diflucan today   Relevant Medications   fluconazole (DIFLUCAN) 100 MG tablet   Other Relevant Orders   Cervicovaginal ancillary only     Long term current use of oral hypoglycemic drug           No follow-ups on file.    Reubin Milan, MD [facility] [place]

## 2023-08-12 NOTE — Assessment & Plan Note (Addendum)
 CT abd/pelvis 03/2023 KIDNEYS/URETERS: Symmetric renal enhancement.  No hydronephrosis.  No solid renal mass. Unchanged bilateral renal cysts and subcentimeter scattered hypodensities too small to characterize on CT.  Has had three infections since December. Klebsiella, E Coli and enterococcus.  Treated with Keflex, Cipro, Macrobid, Monurol and most recently Augmentin Can not see her Urologist until August Will treat with macrobid and get culture

## 2023-08-12 NOTE — Assessment & Plan Note (Signed)
 Blood sugars stable without hypoglycemic symptoms or events. Currently managed with MTF. Changes made last visit are none. Lab Results  Component Value Date   HGBA1C 7.0 (H) 07/22/2022

## 2023-08-13 ENCOUNTER — Encounter: Payer: Self-pay | Admitting: Internal Medicine

## 2023-08-13 LAB — CERVICOVAGINAL ANCILLARY ONLY
Bacterial Vaginitis (gardnerella): NEGATIVE
Candida Glabrata: NEGATIVE
Candida Vaginitis: NEGATIVE
Chlamydia: NEGATIVE
Comment: NEGATIVE
Comment: NEGATIVE
Comment: NEGATIVE
Comment: NEGATIVE
Comment: NEGATIVE
Comment: NORMAL
Neisseria Gonorrhea: NEGATIVE
Trichomonas: NEGATIVE

## 2023-08-15 ENCOUNTER — Encounter: Payer: Self-pay | Admitting: Internal Medicine

## 2023-08-15 LAB — URINE CULTURE

## 2023-08-21 ENCOUNTER — Other Ambulatory Visit: Payer: Self-pay | Admitting: Internal Medicine

## 2023-08-21 DIAGNOSIS — I10 Essential (primary) hypertension: Secondary | ICD-10-CM

## 2023-08-21 NOTE — Telephone Encounter (Signed)
 Requested medications are due for refill today.  yes  Requested medications are on the active medications list.  yes  Last refill. 07/21/2023 #30 0 rf  Future visit scheduled.   yes  Notes to clinic.  Pt already given a courtesy refill.    Requested Prescriptions  Pending Prescriptions Disp Refills   lisinopril (ZESTRIL) 40 MG tablet [Pharmacy Med Name: Lisinopril 40 MG Oral Tablet] 30 tablet 0    Sig: TAKE 1 TABLET BY MOUTH ONCE DAILY . APPOINTMENT REQUIRED FOR FUTURE REFILLS     Cardiovascular:  ACE Inhibitors Failed - 08/21/2023  5:15 PM      Failed - Valid encounter within last 6 months    Recent Outpatient Visits           7 months ago Cervical paraspinal muscle spasm   [place] [facility] at [facility], Ocie Bob, MD   9 months ago Cellulitis of left lower extremity   [place] [facility] at [facility], Nyoka Cowden, MD   1 year ago Essential hypertension   [place] [facility] at [facility], Nyoka Cowden, MD   1 year ago Essential hypertension   [place] [facility] at [facility], Nyoka Cowden, MD   1 year ago Prediabetes   [place] [facility] at [facility], Nyoka Cowden, MD              Passed - Cr in normal range and within 180 days    Creatinine  Date Value Ref Range Status  05/09/2023 0.7 0.5 - 1.1 Final   Creatinine, Ser  Date Value Ref Range Status  07/22/2022 0.79 0.57 - 1.00 mg/dL Final         Passed - K in normal range and within 180 days    Potassium  Date Value Ref Range Status  05/09/2023 3.8 3.5 - 5.1 mEq/L Final         Passed - Patient is not pregnant      Passed - Last BP in normal range    BP Readings from Last 1 Encounters:  08/12/23 126/78

## 2023-08-22 NOTE — Telephone Encounter (Signed)
 Med refill

## 2023-08-24 ENCOUNTER — Other Ambulatory Visit: Payer: Self-pay | Admitting: Internal Medicine

## 2023-08-24 DIAGNOSIS — I10 Essential (primary) hypertension: Secondary | ICD-10-CM

## 2023-08-26 NOTE — Telephone Encounter (Signed)
 Requested Prescriptions  Pending Prescriptions Disp Refills   amLODipine (NORVASC) 5 MG tablet [Pharmacy Med Name: amLODIPine Besylate 5 MG Oral Tablet] 90 tablet 0    Sig: Take 1 tablet by mouth once daily     Cardiovascular: Calcium Channel Blockers 2 Failed - 08/26/2023  9:51 AM      Failed - Last Heart Rate in normal range    Pulse Readings from Last 1 Encounters:  08/12/23 (!) 113         Failed - Valid encounter within last 6 months    Recent Outpatient Visits           7 months ago Cervical paraspinal muscle spasm   [place] [facility], Ocie Bob, MD   9 months ago Cellulitis of left lower extremity   [place] [facility] at [facility], Nyoka Cowden, MD   1 year ago Essential hypertension   [place] [facility] at [facility], Nyoka Cowden, MD   1 year ago Essential hypertension   [place] [facility] at [facility], Nyoka Cowden, MD   1 year ago Prediabetes   [facility] at [facility], Nyoka Cowden, MD              Passed - Last BP in normal range    BP Readings from Last 1 Encounters:  08/12/23 126/78

## 2023-09-10 ENCOUNTER — Other Ambulatory Visit: Payer: Self-pay | Admitting: Internal Medicine

## 2023-09-11 NOTE — Telephone Encounter (Signed)
 Requested Prescriptions  Pending Prescriptions Disp Refills   metFORMIN (GLUCOPHAGE) 500 MG tablet [Pharmacy Med Name: metFORMIN HCl 500 MG Oral Tablet] 90 tablet 0    Sig: Take 1 tablet by mouth once daily with breakfast     Endocrinology:  Diabetes - Biguanides Failed - 09/11/2023  2:22 PM      Failed - HBA1C is between 0 and 7.9 and within 180 days    Hgb A1c MFr Bld  Date Value Ref Range Status  07/22/2022 7.0 (H) 4.8 - 5.6 % Final    Comment:             Prediabetes: 5.7 - 6.4          Diabetes: >6.4          Glycemic control for adults with diabetes: <7.0          Failed - B12 Level in normal range and within 720 days    No results found for: "VITAMINB12"       Failed - Valid encounter within last 6 months    Recent Outpatient Visits           1 month ago Recurrent UTI (urinary tract infection)   [place] [facility] at [facility], Nyoka Cowden, MD              Failed - CBC within normal limits and completed in the last 12 months    WBC  Date Value Ref Range Status  05/09/2023 5.4  Final   RBC  Date Value Ref Range Status  07/22/2022 4.08 3.77 - 5.28 x10E6/uL Final   Hemoglobin  Date Value Ref Range Status  05/09/2023 11.2 (A) 12.0 - 16.0 Final  07/22/2022 12.6 11.1 - 15.9 g/dL Final   HCT  Date Value Ref Range Status  05/09/2023 32 (A) 36 - 46 Final   Hematocrit  Date Value Ref Range Status  07/22/2022 37.6 34.0 - 46.6 % Final   MCHC  Date Value Ref Range Status  07/22/2022 33.5 31.5 - 35.7 g/dL Final   [facility]  Date Value Ref Range Status  07/22/2022 30.9 26.6 - 33.0 pg Final   MCV  Date Value Ref Range Status  07/22/2022 92 79 - 97 fL Final   No results found for: "PLTCOUNTKUC", "LABPLAT", "POCPLA" RDW  Date Value Ref Range Status  07/22/2022 12.5 11.7 - 15.4 % Final         Passed - Cr in normal range and within 360 days    Creatinine  Date Value Ref Range Status  05/09/2023 0.7 0.5 - 1.1 Final    Creatinine, Ser  Date Value Ref Range Status  07/22/2022 0.79 0.57 - 1.00 mg/dL Final         Passed - eGFR in normal range and within 360 days    eGFR  Date Value Ref Range Status  05/09/2023 90  Final  07/22/2022 77 >59 mL/min/1.73 Final

## 2023-11-20 ENCOUNTER — Other Ambulatory Visit: Payer: Self-pay | Admitting: Internal Medicine

## 2023-11-20 DIAGNOSIS — I10 Essential (primary) hypertension: Secondary | ICD-10-CM

## 2023-11-21 NOTE — Telephone Encounter (Signed)
Please review medication refill  request

## 2023-11-21 NOTE — Telephone Encounter (Signed)
 Requested medication (s) are due for refill today: yes   Requested medication (s) are on the active medication list: yes   Last refill:  08/26/23 #90 0 refills  Future visit scheduled:  yes 12/17/23  Notes to clinic:   do you want to give another courtesy refill? #30 or #90?     Requested Prescriptions  Pending Prescriptions Disp Refills   amLODipine  (NORVASC ) 5 MG tablet [Pharmacy Med Name: amLODIPine  Besylate 5 MG Oral Tablet] 90 tablet 0    Sig: Take 1 tablet by mouth once daily     Cardiovascular: Calcium  Channel Blockers 2 Failed - 11/21/2023  4:05 PM      Failed - Last Heart Rate in normal range    Pulse Readings from Last 1 Encounters:  08/12/23 (!) 113         Failed - Valid encounter within last 6 months    Recent Outpatient Visits           3 months ago Recurrent UTI (urinary tract infection)   [place] [facility] at [facility], Chales [place], MD              Passed - Last BP in normal range    BP Readings from Last 1 Encounters:  08/12/23 126/78

## 2023-12-06 ENCOUNTER — Other Ambulatory Visit: Payer: Self-pay | Admitting: Internal Medicine

## 2023-12-08 NOTE — Telephone Encounter (Signed)
 OV 08/12/23 Requested Prescriptions  Pending Prescriptions Disp Refills   metFORMIN  (GLUCOPHAGE ) 500 MG tablet [Pharmacy Med Name: metFORMIN  HCl 500 MG Oral Tablet] 90 tablet 0    Sig: Take 1 tablet by mouth once daily with breakfast     Endocrinology:  Diabetes - Biguanides Failed - 12/08/2023  3:25 PM      Failed - HBA1C is between 0 and 7.9 and within 180 days    Hgb A1c MFr Bld  Date Value Ref Range Status  07/22/2022 7.0 (H) 4.8 - 5.6 % Final    Comment:             Prediabetes: 5.7 - 6.4          Diabetes: >6.4          Glycemic control for adults with diabetes: <7.0          Failed - B12 Level in normal range and within 720 days    No results found for: VITAMINB12       Failed - Valid encounter within last 6 months    Recent Outpatient Visits           3 months ago Recurrent UTI (urinary tract infection)   [place] [facility] at [facility], Leita DEL, MD              Failed - CBC within normal limits and completed in the last 12 months    WBC  Date Value Ref Range Status  05/09/2023 5.4  Final   RBC  Date Value Ref Range Status  07/22/2022 4.08 3.77 - 5.28 x10E6/uL Final   Hemoglobin  Date Value Ref Range Status  05/09/2023 11.2 (A) 12.0 - 16.0 Final  07/22/2022 12.6 11.1 - 15.9 g/dL Final   HCT  Date Value Ref Range Status  05/09/2023 32 (A) 36 - 46 Final   Hematocrit  Date Value Ref Range Status  07/22/2022 37.6 34.0 - 46.6 % Final   MCHC  Date Value Ref Range Status  07/22/2022 33.5 31.5 - 35.7 g/dL Final   [facility]  Date Value Ref Range Status  07/22/2022 30.9 26.6 - 33.0 pg Final   MCV  Date Value Ref Range Status  07/22/2022 92 79 - 97 fL Final   No results found for: PLTCOUNTKUC, LABPLAT, POCPLA RDW  Date Value Ref Range Status  07/22/2022 12.5 11.7 - 15.4 % Final         Passed - Cr in normal range and within 360 days    Creatinine  Date Value Ref Range Status  05/09/2023 0.7 0.5 - 1.1  Final   Creatinine, Ser  Date Value Ref Range Status  07/22/2022 0.79 0.57 - 1.00 mg/dL Final         Passed - eGFR in normal range and within 360 days    eGFR  Date Value Ref Range Status  05/09/2023 90  Final  07/22/2022 77 >59 mL/min/1.73 Final

## 2023-12-17 ENCOUNTER — Encounter

## 2024-01-02 ENCOUNTER — Ambulatory Visit (INDEPENDENT_AMBULATORY_CARE_PROVIDER_SITE_OTHER): Admitting: Internal Medicine

## 2024-01-02 ENCOUNTER — Encounter: Payer: Self-pay | Admitting: Internal Medicine

## 2024-01-02 ENCOUNTER — Ambulatory Visit: Payer: Self-pay

## 2024-01-02 ENCOUNTER — Other Ambulatory Visit: Payer: Self-pay | Admitting: Internal Medicine

## 2024-01-02 VITALS — BP 122/68 | HR 98 | Temp 97.7°F | Ht 68.0 in | Wt 162.0 lb

## 2024-01-02 DIAGNOSIS — C259 Malignant neoplasm of pancreas, unspecified: Secondary | ICD-10-CM

## 2024-01-02 DIAGNOSIS — I10 Essential (primary) hypertension: Secondary | ICD-10-CM | POA: Diagnosis not present

## 2024-01-02 DIAGNOSIS — H938X1 Other specified disorders of right ear: Secondary | ICD-10-CM | POA: Diagnosis not present

## 2024-01-02 DIAGNOSIS — R300 Dysuria: Secondary | ICD-10-CM

## 2024-01-02 DIAGNOSIS — Z7984 Long term (current) use of oral hypoglycemic drugs: Secondary | ICD-10-CM

## 2024-01-02 DIAGNOSIS — E118 Type 2 diabetes mellitus with unspecified complications: Secondary | ICD-10-CM

## 2024-01-02 LAB — POCT URINALYSIS DIPSTICK
Bilirubin, UA: NEGATIVE
Glucose, UA: POSITIVE — AB
Ketones, UA: NEGATIVE
Leukocytes, UA: NEGATIVE
Nitrite, UA: NEGATIVE
Protein, UA: POSITIVE — AB
Spec Grav, UA: 1.02 (ref 1.010–1.025)
Urobilinogen, UA: 0.2 U/dL
pH, UA: 6 (ref 5.0–8.0)

## 2024-01-02 MED ORDER — NITROFURANTOIN MONOHYD MACRO 100 MG PO CAPS: 100.0000 mg | ORAL_CAPSULE | Freq: Two times a day (BID) | ORAL | 0 refills | Status: AC

## 2024-01-02 NOTE — Telephone Encounter (Signed)
 Noted  KP

## 2024-01-02 NOTE — Assessment & Plan Note (Signed)
 Blood sugars have been stable.  No hypoglycemic events since last visit. Currently medications are MTF once a day. Last visit medical regimen changes were none. Lab Results  Component Value Date   HGBA1C 7.0 (H) 07/22/2022  A1C today = 7.7 due to recent diet indiscretion Will continue same regimen.

## 2024-01-02 NOTE — Assessment & Plan Note (Addendum)
 Feels fairly well - complete chemo and XRT Per recent oncology visit: She is now approximately 5 months from the end of her treatment. All treatment related side effects have resolved. Her most recent CT imaging on 6/18 notes stability of her pancreatic mass without any new sites of disease. Chest imaging is also stable and essentially unstable. She met with the medical oncology team today, her CA 19-9 was normal but she is a non-producer. The plan is to continue surveillance follow up and imaging.

## 2024-01-02 NOTE — Telephone Encounter (Signed)
 APPOINTMENT MADE FOR TODAY 01/02/2024 AT 10:20 AM WITH PATIENT'S PCP DR LEITA ADIE  FYI Only or Action Required?: FYI only for provider.  Patient was last seen in primary care on 08/12/2023 by ADIE LEITA DEL, MD.  Called Nurse Triage reporting Urinary Frequency and Burning with Urination.  Symptoms began LAST NIGHT.  Interventions attempted: Nothing.  Symptoms are: gradually worsening.  Triage Disposition: See Physician Within 24 Hours  Patient/caregiver understands and will follow disposition?: YES                                Copied from CRM #[id number]. Topic: Clinical - Red Word Triage >> Jan 02, 2024  8:01 AM Charlet HERO wrote: Red Word that prompted transfer to Nurse Triage: Patient  is calling about painful urination and it is frequent started last night Reason for Disposition  Urinating more frequently than usual (i.e., frequency) OR new-onset of the feeling of an urgent need to urinate (i.e., urgency)  Answer Assessment - Initial Assessment Questions 1. SYMPTOM: What's the main symptom you're concerned about? (e.g., frequency, incontinence)     frequency 2. ONSET: When did the  frequency  start?     Last night 3. PAIN: Is there any pain? If Yes, ask: How bad is it? (Scale: 1-10; mild, moderate, severe)     3 4. CAUSE: What do you think is causing the symptoms?     I have frequent UTIs 5. OTHER SYMPTOMS: Do you have any other symptoms? (e.g., blood in urine, fever, flank pain, pain with urination)     Pain with urination  Protocols used: Urinary Symptoms-A-AH

## 2024-01-02 NOTE — Progress Notes (Signed)
 Date:  01/02/2024   Name:  Jenna Sheppard   DOB:  September 24, 1945   MRN:  [id number]   Chief Complaint: Urinary Tract Infection (Frequency and dysuria since last night.)  Urinary Tract Infection  This is a new problem. The current episode started yesterday. The quality of the pain is described as burning. The pain is mild. There has been no fever. Pertinent negatives include no chills or hematuria.  Diabetes She presents for her follow-up diabetic visit. She has type 2 diabetes mellitus. Disease course: may be worse due to recent cruise. Pertinent negatives for hypoglycemia include no nervousness/anxiousness. Pertinent negatives for diabetes include no chest pain and no fatigue. Symptoms are stable. Current diabetic treatment includes oral agent (monotherapy) (MTF).    Review of Systems  Constitutional:  Negative for chills, fatigue and fever.  HENT:  Negative for congestion and ear pain.   Respiratory:  Positive for cough. Negative for chest tightness and shortness of breath.   Cardiovascular:  Negative for chest pain and palpitations.  Genitourinary:  Positive for dysuria. Negative for hematuria.  Skin:  Positive for color change (bruise of forehead and face) and wound (abrasion left cheek).  Psychiatric/Behavioral:  Negative for dysphoric mood and sleep disturbance. The patient is not nervous/anxious.      Lab Results  Component Value Date   NA 142 05/09/2023   K 3.8 05/09/2023   CO2 28 (A) 05/09/2023   GLUCOSE 142 (H) 07/22/2022   BUN 16 05/09/2023   CREATININE 0.7 05/09/2023   CALCIUM  9.6 07/22/2022   EGFR 90 05/09/2023   Lab Results  Component Value Date   CHOL 189 07/22/2022   HDL 80 07/22/2022   LDLCALC 88 07/22/2022   TRIG 124 07/22/2022   CHOLHDL 2.4 07/22/2022   Lab Results  Component Value Date   TSH 2.090 07/22/2022   Lab Results  Component Value Date   HGBA1C 7.0 (H) 07/22/2022   Lab Results  Component Value Date   WBC 5.4 05/09/2023   HGB 11.2 (A)  05/09/2023   HCT 32 (A) 05/09/2023   MCV 92 07/22/2022   PLT 117 (A) 05/09/2023   Lab Results  Component Value Date   ALT 21 07/22/2022   AST 19 07/22/2022   ALKPHOS 68 07/22/2022   BILITOT 0.6 07/22/2022   Lab Results  Component Value Date   VD25OH 44.6 03/19/2022     Patient Active Problem List   Diagnosis Date Noted   Recurrent UTI (urinary tract infection) 08/12/2023   Cervical paraspinal muscle spasm 01/14/2023   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   Neoplasm of uncertain behavior of skin 11/22/2021   Age-related osteoporosis without current pathological fracture 09/04/2021   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   Ovarian failure 07/04/2021   Type II diabetes mellitus with complication (HCC) 07/04/2021   Varicose veins of both lower extremities 07/04/2021   Essential hypertension 10/02/2016   Mixed hyperlipidemia 10/02/2016    Allergies  Allergen Reactions   Molds & Smuts Shortness Of Breath   Red Dye #40 (Allura Red) Rash   Sulfa Antibiotics Nausea Only    Past Surgical History:  Procedure Laterality Date   ABDOMINAL HYSTERECTOMY  01/21/2005   Still has 1 ovary   ABLATION ON ENDOMETRIOSIS  01/08/1990   BREAST BIOPSY Right    benign, 20 years ago   BREAST SURGERY  02/16/1985   2nd surgery - 04/29/1996   CHOLECYSTECTOMY  07/21/2000   DILATION AND  CURETTAGE OF UTERUS  02/18/1989   laser eye  02/09/2010   WRIST ARTHROSCOPY Left 04/01/2017    Social History   Tobacco Use   Smoking status: Never   Smokeless tobacco: Never  Vaping Use   Vaping status: Never Used  Substance Use Topics   Alcohol use: Yes    Alcohol/week: 1.0 standard drink of alcohol    Types: 1 Glasses of wine per week    Comment: RARE   Drug use: Never     Medication list has been reviewed and updated.  Current Meds  Medication Sig   amLODipine  (NORVASC ) 5 MG tablet Take 1 tablet by mouth once daily   calcium  carbonate  (OS-CAL - DOSED IN MG OF ELEMENTAL CALCIUM ) 1250 (500 Ca) MG tablet Take 1 tablet by mouth.   diclofenac  Sodium (VOLTAREN) 1 % GEL Apply topically 4 (four) times daily. PRN   ELIQUIS 5 MG TABS tablet Take 5 mg by mouth 2 (two) times daily.   estradiol  (ESTRACE ) 0.1 MG/GM vaginal cream estradiol  0.01% (0.1 mg/gram) vaginal cream  APPLY AS DIRECTED BY TOPICAL ROUTE EVERY OTHER DAY THREE TIMES A WEEK USING FINGERTIP   glucose blood test strip OneTouch Ultra Test strips  TEST ONCE DAILY   Krill Oil 1000 MG CAPS Take by mouth.   Lancets (ONETOUCH DELICA PLUS LANCET33G) MISC OneTouch Delica Plus Lancet 33 gauge  USE ONE LANCET ONE TIME DAILY FOR CHECKING OF BLOOD GLUCOSE   lisinopril  (ZESTRIL ) 40 MG tablet Take 1 tablet (40 mg total) by mouth daily.   metFORMIN  (GLUCOPHAGE ) 500 MG tablet Take 1 tablet by mouth once daily with breakfast   Multiple Vitamins-Minerals (MULTIVITAMIN WITH MINERALS) tablet Take 1 tablet by mouth daily.   nitrofurantoin , macrocrystal-monohydrate, (MACROBID ) 100 MG capsule Take 1 capsule (100 mg total) by mouth 2 (two) times daily for 7 days.       01/02/2024   10:15 AM 08/12/2023   10:26 AM 08/12/2023   10:25 AM 11/20/2022   11:04 AM  GAD 7 : Generalized Anxiety Score  Nervous, Anxious, on Edge 0 1 0 0  Control/stop worrying 0 1 0 0  Worry too much - different things 0  0 0  Trouble relaxing 0  0 0  Restless 0  0 0  Easily annoyed or irritable 0  0 0  Afraid - awful might happen 0  0 0  Total GAD 7 Score 0  0 0  Anxiety Difficulty Not difficult at all  Not difficult at all Not difficult at all       01/02/2024   10:14 AM 08/12/2023   10:25 AM 12/05/2022    9:03 AM  Depression screen PHQ 2/9  Decreased Interest 0 0 0  Down, Depressed, Hopeless 0 0 0  PHQ - 2 Score 0 0 0  Altered sleeping 0  0  Tired, decreased energy 0  0  Change in appetite 0  0  Feeling bad or failure about yourself  0  0  Trouble concentrating 0  0  Moving slowly or fidgety/restless 0  0   Suicidal thoughts 0  0  PHQ-9 Score 0  0  Difficult doing work/chores Not difficult at all  Not difficult at all    BP Readings from Last 3 Encounters:  01/02/24 122/68  08/12/23 126/78  01/14/23 128/88    Physical Exam Vitals and nursing note reviewed.  Constitutional:      General: She is not in acute distress.    Appearance: Normal  appearance. She is well-developed.  HENT:     Head: Normocephalic and atraumatic.      Comments: Large hematoma and excoriation    Right Ear: No middle ear effusion. Tympanic membrane is retracted. Tympanic membrane is not perforated or erythematous.     Left Ear:  No middle ear effusion. Tympanic membrane is retracted. Tympanic membrane is not perforated or erythematous.  Cardiovascular:     Rate and Rhythm: Normal rate and regular rhythm.     Heart sounds: No murmur heard. Pulmonary:     Effort: Pulmonary effort is normal. No respiratory distress.     Breath sounds: No wheezing or rhonchi.  Musculoskeletal:     Cervical back: Normal range of motion.  Lymphadenopathy:     Cervical: No cervical adenopathy.  Skin:    General: Skin is warm and dry.     Findings: Abrasion and ecchymosis present. No rash.     Comments: Forehead and face  Neurological:     Mental Status: She is alert and oriented to person, place, and time.  Psychiatric:        Attention and Perception: Attention and perception normal.        Mood and Affect: Mood normal.        Behavior: Behavior normal.     Wt Readings from Last 3 Encounters:  01/02/24 162 lb (73.5 kg)  01/14/23 158 lb (71.7 kg)  12/05/22 153 lb (69.4 kg)    BP 122/68   Pulse 98   Temp 97.7 F (36.5 C) (Oral)   Ht 5' 8 (1.727 m)   Wt 162 lb (73.5 kg)   SpO2 97%   BMI 24.63 kg/m   Assessment and Plan:  Problem List Items Addressed This Visit       Unprioritized   Essential hypertension (Chronic)   Blood pressure is well controlled on lisinopril  and amlodipine . No medication side  effects noted. Plan to continue current medications.       Pancreatic adenocarcinoma (HCC)   Feels fairly well - complete chemo and XRT Per recent oncology visit: She is now approximately 5 months from the end of her treatment. All treatment related side effects have resolved. Her most recent CT imaging on 6/18 notes stability of her pancreatic mass without any new sites of disease. Chest imaging is also stable and essentially unstable. She met with the medical oncology team today, her CA 19-9 was normal but she is a non-producer. The plan is to continue surveillance follow up and imaging.       Relevant Medications   nitrofurantoin , macrocrystal-monohydrate, (MACROBID ) 100 MG capsule   Type II diabetes mellitus with complication (HCC)   Blood sugars have been stable.  No hypoglycemic events since last visit. Currently medications are MTF once a day. Last visit medical regimen changes were none. Lab Results  Component Value Date   HGBA1C 7.0 (H) 07/22/2022  A1C today = 7.7 due to recent diet indiscretion Will continue same regimen.       Other Visit Diagnoses       Dysuria    -  Primary   UA + RBC, glucose  will treat empirically and obtain UCx   Relevant Medications   nitrofurantoin , macrocrystal-monohydrate, (MACROBID ) 100 MG capsule   Other Relevant Orders   Urine Culture   POCT urinalysis dipstick (Completed)     Ear congestion, right       no evidence of infection continue Flonase, add Coricidin HBP short term  Long term current use of oral hypoglycemic drug           Return in about 4 months (around 05/04/2024) for DM.    Leita HILARIO Adie, MD [facility] [place]

## 2024-01-02 NOTE — Assessment & Plan Note (Signed)
 Blood pressure is well controlled on lisinopril  and amlodipine . No medication side effects noted. Plan to continue current medications.

## 2024-01-02 NOTE — Patient Instructions (Signed)
 Continue Flonase nasal spray and add Coricidin HBP twice a day as needed for symptom relief.

## 2024-01-07 LAB — URINE CULTURE

## 2024-01-08 ENCOUNTER — Ambulatory Visit: Payer: Self-pay | Admitting: Student

## 2024-01-08 NOTE — Progress Notes (Signed)
 Spoke with patient and she said today is last day of medication and she does not feel like her UTI is all the way gone. Please advise patient  JM

## 2024-01-10 ENCOUNTER — Other Ambulatory Visit: Payer: Self-pay | Admitting: Internal Medicine

## 2024-01-10 ENCOUNTER — Encounter: Payer: Self-pay | Admitting: Internal Medicine

## 2024-01-10 DIAGNOSIS — N3000 Acute cystitis without hematuria: Secondary | ICD-10-CM

## 2024-01-10 MED ORDER — CIPROFLOXACIN HCL 250 MG PO TABS: 250.0000 mg | ORAL_TABLET | Freq: Two times a day (BID) | ORAL | 0 refills | Status: AC

## 2024-01-10 NOTE — Progress Notes (Unsigned)
 Date:  01/10/2024   Name:  Jenna [facility]pp[facility]   DOB:  03-10-1946   MRN:  [id number]   Chief Complaint: No chief complaint on file.  HPI  Review of Systems   Lab Results  Component Value Date   NA 142 05/09/2023   K 3.8 05/09/2023   CO2 28 (A) 05/09/2023   GLUCOSE 142 (H) 07/22/2022   BUN 16 05/09/2023   CREATININE 0.7 05/09/2023   CALCIUM  9.6 07/22/2022   EGFR 90 05/09/2023   Lab Results  Component Value Date   CHOL 189 07/22/2022   HDL 80 07/22/2022   LDLCALC 88 07/22/2022   TRIG 124 07/22/2022   CHOLHDL 2.4 07/22/2022   Lab Results  Component Value Date   TSH 2.090 07/22/2022   Lab Results  Component Value Date   HGBA1C 7.0 (H) 07/22/2022   Lab Results  Component Value Date   WBC 5.4 05/09/2023   HGB 11.2 (A) 05/09/2023   HCT 32 (A) 05/09/2023   MCV 92 07/22/2022   PLT 117 (A) 05/09/2023   Lab Results  Component Value Date   ALT 21 07/22/2022   AST 19 07/22/2022   ALKPHOS 68 07/22/2022   BILITOT 0.6 07/22/2022   Lab Results  Component Value Date   VD25OH 44.6 03/19/2022     Patient Active Problem List   Diagnosis Date Noted   Recurrent UTI (urinary tract infection) 08/12/2023   Cervical paraspinal muscle spasm 01/14/2023   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   Neoplasm of uncertain behavior of skin 11/22/2021   Age-related osteoporosis without current pathological fracture 09/04/2021   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   Ovarian failure 07/04/2021   Type II diabetes mellitus with complication (HCC) 07/04/2021   Varicose veins of both lower extremities 07/04/2021   Essential hypertension 10/02/2016   Mixed hyperlipidemia 10/02/2016    Allergies  Allergen Reactions   Molds & Smuts Shortness Of Breath   Red Dye #40 (Allura Red) Rash   Sulfa Antibiotics Nausea Only    Past Surgical History:  Procedure Laterality Date   ABDOMINAL HYSTERECTOMY  01/21/2005   Still has 1 ovary    ABLATION ON ENDOMETRIOSIS  01/08/1990   BREAST BIOPSY Right    benign, 20 years ago   BREAST SURGERY  02/16/1985   2nd surgery - 04/29/1996   CHOLECYSTECTOMY  07/21/2000   DILATION AND CURETTAGE OF UTERUS  02/18/1989   laser eye  02/09/2010   WRIST ARTHROSCOPY Left 04/01/2017    Social History   Tobacco Use   Smoking status: Never   Smokeless tobacco: Never  Vaping Use   Vaping status: Never Used  Substance Use Topics   Alcohol use: Yes    Alcohol/week: 1.0 standard drink of alcohol    Types: 1 Glasses of wine per week    Comment: RARE   Drug use: Never     Medication list has been reviewed and updated.  No outpatient medications have been marked as taking for the 01/10/24 encounter (Orders Only) with Justus Leita DEL, MD.       01/02/2024   10:15 AM 08/12/2023   10:26 AM 08/12/2023   10:25 AM 11/20/2022   11:04 AM  GAD 7 : Generalized Anxiety Score  Nervous, Anxious, on Edge 0 1 0 0  Control/stop worrying 0 1 0 0  Worry too much - different things 0  0 0  Trouble relaxing 0  0 0  Restless 0  0 0  Easily annoyed or irritable 0  0 0  Afraid - awful might happen 0  0 0  Total GAD 7 Score 0  0 0  Anxiety Difficulty Not difficult at all  Not difficult at all Not difficult at all       01/02/2024   10:14 AM 08/12/2023   10:25 AM 12/05/2022    9:03 AM  Depression screen PHQ 2/9  Decreased Interest 0 0 0  Down, Depressed, Hopeless 0 0 0  PHQ - 2 Score 0 0 0  Altered sleeping 0  0  Tired, decreased energy 0  0  Change in appetite 0  0  Feeling bad or failure about yourself  0  0  Trouble concentrating 0  0  Moving slowly or fidgety/restless 0  0  Suicidal thoughts 0  0  PHQ-9 Score 0  0  Difficult doing work/chores Not difficult at all  Not difficult at all    BP Readings from Last 3 Encounters:  01/02/24 122/68  08/12/23 126/78  01/14/23 128/88    Physical Exam  Wt Readings from Last 3 Encounters:  01/02/24 162 lb (73.5 kg)  01/14/23 158 lb (71.7 kg)   12/05/22 153 lb (69.4 kg)    There were no vitals taken for this visit.  Assessment and Plan:  Problem List Items Addressed This Visit   None   No follow-ups on file.    Leita HILARIO Adie, MD [facility] [place]

## 2024-01-12 NOTE — Telephone Encounter (Signed)
 Pt read message.  KP

## 2024-01-14 NOTE — ED Provider Notes (Signed)
 [facility] Provider Note   ED Clinical Impression   Final diagnoses:  Acute right-sided low back pain without sciatica (Primary)  Elevated LFTs  Urinary frequency   Initial Impression, ED Course, Assessment and Plan   Impression: lower back pain  Patient is a 78 y.o. female with PMH of HTN, HLD, T2DM, and stage IV pancreatic cancer (finished radiation and chemotherapy 6 months ago) presenting for a couple of days of worsening waxing and waning right-sided lower back pain. Dx with UTI about 2 weeks ago, took macrobid  without imrpovement, then prescribed cipro , which she only has one pill left.  Symptoms have improved, but not completely gone.  Still with mild dysuria.  No fever, chills, nausea or vomiting.   On exam, the patient is alert, oriented, and in no acute distress. VS are within normal limits.  Heart sounds regular rate and rhythm.  Lungs clear to oscillation bilaterally.  Abdomen soft, and tender to palpation in the right upper quadrant and epigastric regions.  No rebound or guarding.  No CVA tenderness.  She does have some mild right lower lumbar muscular tenderness to palpation.  No lower abdominal tenderness.  Chart reviewed, patient had a urine culture from 01/02/2024 that did grow Klebsiella susceptible to cephalosporins and ciprofloxacin , resistant to Macrobid .  Labs are paced from triage with normal CBC, and CMP notable for mild LFT elevation (AST 41, ALT 53, alk phos 125) otherwise normal.  Normal creatinine.  Given her upper abdominal pain and new LFT elevation, we will get a CT scan to rule out any new liver lesions, also to look at kidney to rule out pyelonephritis, though suspect her lower back pain is more muscular in nature.  It is possible that the ciprofloxacin  has caused a mild LFT elevation as well.  She is s/p cholecystectomy. Patient declines pain medication at this time.  10:04 PM Discussed with radiology resident on-call. Pending  report, but states no changes from prior with stable pancreatic lesion, no new liver lesions or signs of inflammation/pyelonephritis around her kidney.  Plan for discharge home, will treat her UTI symptoms with 5 more days of cefuroxime.  Advised PCP follow-up for LFT recheck in 4 to 6 weeks to ensure improvement.  ED return precautions discussed as well.  For her back pain, recommended heat, gentle stretching, massage, and continue Tylenol as needed.  ____________________________________________  Time seen: January 14, 2024 7:04 PM  I have reviewed the triage vital signs and the nursing notes.  This visit was not staffed with an ED attending.  Additional Medical Decision Making   I have reviewed the vital signs and the nursing notes. Labs and radiology results that were available during my care of the patient were independently reviewed by me and considered in my medical decision making.  I independently visualized the radiology images.  I reviewed the patient's prior medical records (PCP).  I discussed the case with the radiologist.   History   Chief Complaint Back Pain   HPI  Jenna Sheppard is a 78 y.o. female with a PMH of HTN, HLD, T2DM, and stage IV pancreatic cancer (finished radiation and chemotherapy) who presents to the ED for evaluation of back pain. The patient reports a couple of days of worsening waxing and waning right-sided lower back pain. She has had chronic UTIs for the past year. She is currently on a second antibiotic treatment, ciprofloxacin , for her current UTI. She was initially on Macrobid  which was not working. She had dysuria  and increased urinary frequency with her UTI. She notes some improvement of her dysuria but states it is not significant. She still endorses increased urinary frequency. She did 9 months of chemotherapy and is not on chemotherapy right now. She notes some nausea but notes this is not out of the ordinary for her. She has been able to tolerate PO  intake of solids and fluids as normal. She has a history of cholecystectomy around 15 years ago. She denies any new physical activity or heavy lifting. She denies any pain after eating. She denies any new medications. She denies any emesis, fever, chills, or numbness/tingling in her extremities.    Past Medical History[1]  Problem List[2]  Past Surgical History[3]  No current facility-administered medications for this encounter.  Current Outpatient Medications:  .  amlodipine  (NORVASC ) 5 MG tablet, Take 1 tablet (5 mg total) by mouth daily., Disp: , Rfl:  .  calcium  carbonate 300 mg (750 mg) Chew, Chew., Disp: , Rfl:  .  cephalexin (KEFLEX) 250 MG capsule, Take 1 capsule (250 mg total) by mouth daily., Disp: 30 capsule, Rfl: 6 .  cholecalciferol, vitamin D3-250 mcg, 10,000 unit,, 250 mcg (10,000 unit) capsule, 10,000 units once daily x 4 weeks, Disp: , Rfl:  .  ELIQUIS 5 mg Tab, Take 1 tablet by mouth twice daily, Disp: 60 tablet, Rfl: 0 .  estradiol  (ESTRACE ) 0.01 % (0.1 mg/gram) vaginal cream, Insert 2 g into the vagina 3 (three) times a week., Disp: 42.5 g, Rfl: 11 .  lisinopril  (PRINIVIL ,ZESTRIL ) 40 MG tablet, Take 1 tablet (40 mg total) by mouth daily., Disp: , Rfl:  .  metFORMIN  (GLUCOPHAGE ) 500 MG tablet, Take 1 tablet (500 mg total) by mouth in the morning., Disp: , Rfl:  .  multivitamin with minerals tablet, Take 1 tablet by mouth daily., Disp: , Rfl:  .  ondansetron (ZOFRAN-ODT) 4 MG disintegrating tablet, Dissolve 1 tablet (4 mg total) in the mouth every eight (8) hours as needed for nausea., Disp: , Rfl:  .  prochlorperazine (COMPAZINE) 10 MG tablet, Take 1 tablet (10 mg total) by mouth every six (6) hours as needed for nausea., Disp: , Rfl:   Allergies Mold, House dust, Red dye, and Sulfa (sulfonamide antibiotics)  Family History[4]  Social History Short Social History[5]  Review of Systems  A complete review of systems was performed and is negative other than as  addressed in the HPI.  Physical Exam   ED Triage Vitals [01/14/24 1825]  Enc Vitals Group     BP 180/96     Pulse 76     SpO2 Pulse      Resp 18     Temp 36.5 C (97.7 F)     Temp src      SpO2 98 %     Weight      Height      Head Circumference      Peak Flow      Pain Score      Pain Loc      Pain Education      Exclude from Growth Chart     Constitutional: Alert and oriented. Well appearing and in no distress. Eyes: Conjunctivae are normal. ENT      Head: Normocephalic and atraumatic.      Mouth/Throat: Mucous membranes are moist.      Neck: Supple Hematological/Lymphatic/Immunilogical: No cervical lymphadenopathy. Cardiovascular: Normal rate, regular rhythm.  Respiratory: Normal respiratory effort. Breath sounds are normal. Gastrointestinal: Soft, with TTP  in epigastrium and RUQ.  No rebound, mild guarding.  There is no CVA tenderness. Musculoskeletal: Nontender with normal range of motion in all extremities.      Right lower leg: No tenderness or edema.      Left lower leg: No tenderness or edema. Neurologic: Normal speech and language. No gross focal neurologic deficits are appreciated. Skin: Skin is warm, dry and intact. No rash noted. Psychiatric: Mood and affect are normal. Speech and behavior are normal.  Radiology   CT Abdomen Pelvis W IV Contrast Only  Final Result    1.  No acute finding within the abdomen or pelvis.    2.  Mixed cystic/solid pancreatic tail mass (invasive adenocarcinoma, possibly arising from a mucinous cystic neoplasm or intraductal papillary mucinous neoplasm, on surgical pathology 08/19/2022) is similar in size to multiple prior exams. This abuts and possibly invades the lesser curvature the stomach and abuts multiple vascular structures, including occlusion of the splenic vein with left upper quadrant collateral vessels.    3.  Ellipsoid right groin lesion is unchanged dating back to the earliest comparison CT dated 08/17/2022.            Labs   Labs Reviewed  COMPREHENSIVE METABOLIC PANEL - Abnormal; Notable for the following components:      Result Value   AST 41 (*)    ALT 53 (*)    Alkaline Phosphatase 125 (*)    All other components within normal limits  CBC W/ AUTO DIFF - Abnormal; Notable for the following components:   RBC 3.87 (*)    Absolute Lymphocytes 1.0 (*)    All other components within normal limits  URINALYSIS WITH MICROSCOPY WITH CULTURE REFLEX PERFORMABLE - Abnormal; Notable for the following components:   Leukocyte Esterase, UA Trace (*)    Protein, UA 30 mg/dL (*)    Glucose, UA 699 mg/dL (*)    Ketones, UA Trace (*)    WBC, UA 6 (*)    Squam Epithel, UA 39 (*)    Bacteria, UA Rare (*)    Mucus, UA Few (*)    All other components within normal limits  URINE CULTURE  CBC W/ DIFFERENTIAL   Narrative:    The following orders were created for panel order CBC w/ Differential. Procedure                               Abnormality         Status                    ---------                               -----------         ------                    CBC w/ Differential[[id number]]         Abnormal            Final result               Please view results for these tests on the individual orders.  URINALYSIS WITH MICROSCOPY WITH CULTURE REFLEX   Narrative:    The following orders were created for panel order Urinalysis with Microscopy with Culture Reflex. Procedure  Abnormality         Status                    ---------                               -----------         ------                    Urinalysis with Microsc.SABRASABRA[[id number]]  Abnormal            Final result               Please view results for these tests on the individual orders.    Pertinent labs & imaging results that were available during my care of the patient were reviewed by me and considered in my medical decision making (see chart for details).   Documentation assistance was provided by  Ike Fisherman, Scribe on January 14, 2024 at 7:04 PM for Alan Funk, FNP.  A scribe was used when documenting this visit. I agree with the above documentation. Signed by  Alan JONETTA Funk, FNP on  January 14, 2024 at 10:16 PM        [1] Past Medical History: Diagnosis Date  . Arthritis   . Cancer       IV pancreatic cancer  . Diabetes mellitus      . Hypertension   . Urinary tract infection   [2] Patient Active Problem List Diagnosis  . Primary hypertension  . Type 2 diabetes mellitus without complication, without long-term current use of insulin     . HLD (hyperlipidemia)  . Pancreatic mass (HHS-HCC)  . Splenic vein thrombosis  . Constipation  . Pancreatic adenocarcinoma     . High risk medication use  . Need for hepatitis B screening test  . Encounter for antineoplastic chemotherapy  . Urinary frequency  [3] Past Surgical History: Procedure Laterality Date  . CHG X-RAY FOR BILE DUCT ENDOSCOPY  08/20/2022   Procedure: ENDOSCOPIC CATHETERIZATION OF THE BILIARY DUCTAL SYSTEM, RADIOLOGICAL SUPERVISION AND INTERPRETATION;  Surgeon: Minnie Krystal Claude, MD;  [facility];  Service: Gastroenterology  . CHOLECYSTECTOMY    . HYSTERECTOMY    . IR INSERT PORT AGE GREATER THAN 5 YRS  09/03/2022   IR INSERT PORT AGE GREATER THAN 5 YRS 09/03/2022 Evron, Charmaine Lesches, PA IMG [facility]  . PR ERCP,DIAGNOSTIC N/A 08/20/2022   Procedure: ERCP; DIAGNOSTIC, WITH OR WITHOUT COLLECTION OF SPECIMEN(S) BY BRUSHING OR WASHING;  Surgeon: Minnie Krystal Claude, MD;  Location: [facility];  Service: Gastroenterology  . PR ERCP,SPHINCTEROTOMY N/A 08/20/2022   Procedure: ERCP; W/SPHINCTEROTOMY/PAPILLOTOMY;  Surgeon: Minnie Krystal Claude, MD;  [facility]  Service: Gastroenterology  . PR UPGI ENDOSCOPY W/US  FN BX N/A 08/19/2022   Procedure: UGI W/ TRANSENDOSCOPIC ULTRASOUND GUIDED INTRAMURAL/TRANSMURAL FINE NEEDLE ASPIRATION/BIOPSY(S),  ESOPHAGUS;  Surgeon: Enos Olam Jansky, MD;  Location: [facility];  Service: Gastroenterology  [4] Family History Problem Relation Age of Onset  . Cancer Mother        sister  . Hypertension Mother        mother  . Cancer Father   . Hypertension Father   . Prostate cancer Father   [5] Social History Tobacco Use  . Smoking status: Never  . Smokeless tobacco: Never  Substance Use Topics  . Alcohol use: Yes  Comment: 1-2 drinks a month  . Drug use: Never   Fernande Alan BIRCH, FNP 01/14/24 2228

## 2024-02-16 ENCOUNTER — Other Ambulatory Visit: Payer: Self-pay | Admitting: Internal Medicine

## 2024-02-16 DIAGNOSIS — I10 Essential (primary) hypertension: Secondary | ICD-10-CM

## 2024-02-17 NOTE — Telephone Encounter (Signed)
 Requested medication (s) are due for refill today:   Yes  Requested medication (s) are on the active medication list:   Yes  Future visit scheduled:   Yes 9/18 AWV and 11/25 with Dr. Justus    Last ordered: 08/22/2023 #90, 1 refill  Unable to refill because labs are due     Requested Prescriptions  Pending Prescriptions Disp Refills   lisinopril  (ZESTRIL ) 40 MG tablet [Pharmacy Med Name: Lisinopril  40 MG Oral Tablet] 90 tablet 0    Sig: Take 1 tablet by mouth once daily     Cardiovascular:  ACE Inhibitors Failed - 02/17/2024 10:38 AM      Failed - Cr in normal range and within 180 days    Creatinine  Date Value Ref Range Status  05/09/2023 0.7 0.5 - 1.1 Final   Creatinine, Ser  Date Value Ref Range Status  07/22/2022 0.79 0.57 - 1.00 mg/dL Final         Failed - K in normal range and within 180 days    Potassium  Date Value Ref Range Status  05/09/2023 3.8 3.5 - 5.1 mEq/L Final         Failed - Valid encounter within last 6 months    Recent Outpatient Visits           1 month ago Dysuria   [place] [facility], Leita DEL, MD   6 months ago Recurrent UTI (urinary tract infection)    [facility] at [facility], Leita DEL, MD              Passed - Patient is not pregnant      Passed - Last BP in normal range    BP Readings from Last 1 Encounters:  01/02/24 122/68

## 2024-02-26 ENCOUNTER — Ambulatory Visit: Admitting: Emergency Medicine

## 2024-02-26 VITALS — Ht 68.0 in | Wt 160.0 lb

## 2024-02-26 DIAGNOSIS — Z0000 Encounter for general adult medical examination without abnormal findings: Secondary | ICD-10-CM

## 2024-02-26 NOTE — Patient Instructions (Signed)
 Jenna Sheppard,  Thank you for taking the time for your Medicare Wellness Visit. I appreciate your continued commitment to your health goals. Please review the care plan we discussed, and feel free to reach out if I can assist you further.  Medicare recommends these wellness visits once per year to help you and your care team stay ahead of potential health issues. These visits are designed to focus on prevention, allowing your provider to concentrate on managing your acute and chronic conditions during your regular appointments.  Please note that Annual Wellness Visits do not include a physical exam. Some assessments may be limited, especially if the visit was conducted virtually. If needed, we may recommend a separate in-person follow-up with your provider.  Ongoing Care Seeing your primary care provider every 3 to 6 months helps us  monitor your health and provide consistent, personalized care.   Referrals If a referral was made during today's visit and you haven't received any updates within two weeks, please contact the referred provider directly to check on the status.  Recommended Screenings:  Get the flu and covid vaccines at your convenience.  Check to see if your colonoscopy is due in 3 or 5 years.  Get a DM eye exam at your convenience. I have included a list of local eye doctors.    Health Maintenance  Topic Date Due   Eye exam for diabetics  Never done   Hemoglobin A1C  01/20/2023   Colon Cancer Screening  02/25/2023   Yearly kidney health urinalysis for diabetes  07/23/2023   Complete foot exam   07/23/2023   DEXA scan (bone density measurement)  09/05/2023   Flu Shot  01/09/2024   COVID-19 Vaccine (7 - 2025-26 season) 02/09/2024   Yearly kidney function blood test for diabetes  05/08/2024   Medicare Annual Wellness Visit  02/25/2025   DTaP/Tdap/Td vaccine (2 - Td or Tdap) 11/02/2031   Pneumococcal Vaccine for age over 78  Completed   Hepatitis C Screening  Completed    Zoster (Shingles) Vaccine  Completed   HPV Vaccine  Aged Out   Meningitis B Vaccine  Aged Out       02/26/2024    3:21 PM  Advanced Directives  Does Patient Have a Medical Advance Directive? Yes  Type of Estate agent of [place];Living will  Does patient want to make changes to medical advance directive? No - Patient declined  Copy of Healthcare Power of Attorney in Chart? No - copy requested   Advance Care Planning is important because it: Ensures you receive medical care that aligns with your values, goals, and preferences. Provides guidance to your family and loved ones, reducing the emotional burden of decision-making during critical moments.  Vision: Annual vision screenings are recommended for early detection of glaucoma, cataracts, and diabetic retinopathy. These exams can also reveal signs of chronic conditions such as diabetes and high blood pressure.  Dental: Annual dental screenings help detect early signs of oral cancer, gum disease, and other conditions linked to overall health, including heart disease and diabetes.  Please see the attached documents for additional preventive care recommendations.   There are several Eye Doctors in your area. Here are a few that usually accept all insurance types:  [facility] [address] [place], [place] [address] Phone: [phone]  [facility] [address] [place], [place] [address] Phone: [phone]  [facility] [address] [place], [place] [address] Phone: [phone]  [facility] [address] [place]  [place], [place] [address] Phone: [phone]  [facility] [address] [place], [place] [address] Phone: [phone]  [facility] [address] [place], [place] [address] Phone: [phone]  Please let us  know if you require a referral for an eye exam appointment. Thank you!    Fall Prevention in the Home, Adult Falls can cause injuries and affect people of all  ages. There are many simple things that you can do to make your home safe and to help prevent falls. If you need it, ask for help making these changes. What actions can I take to prevent falls? General information Use good lighting in all rooms. Make sure to: Replace any light bulbs that burn out. Turn on lights if it is dark and use night-lights. Keep items that you use often in easy-to-reach places. Lower the shelves around your home if needed. Move furniture so that there are clear paths around it. Do not keep throw rugs or other things on the floor that can make you trip. If any of your floors are uneven, fix them. Add color or contrast paint or tape to clearly mark and help you see: Grab bars or handrails. First and last steps of staircases. Where the edge of each step is. If you use a ladder or stepladder: Make sure that it is fully opened. Do not climb a closed ladder. Make sure the sides of the ladder are locked in place. Have someone hold the ladder while you use it. Know where your pets are as you move through your home. What can I do in the bathroom?     Keep the floor dry. Clean up any water that is on the floor right away. Remove soap buildup in the bathtub or shower. Buildup makes bathtubs and showers slippery. Use non-skid mats or decals on the floor of the bathtub or shower. Attach bath mats securely with double-sided, non-slip rug tape. If you need to sit down while you are in the shower, use a non-slip stool. Install grab bars by the toilet and in the bathtub and shower. Do not use towel bars as grab bars. What can I do in the bedroom? Make sure that you have a light by your bed that is easy to reach. Do not use any sheets or blankets on your bed that hang to the floor. Have a firm bench or chair with side arms that you can use for support when you get dressed. What can I do in the kitchen? Clean up any spills right away. If you need to reach something above you,  use a sturdy step stool that has a grab bar. Keep electrical cables out of the way. Do not use floor polish or wax that makes floors slippery. What can I do with my stairs? Do not leave anything on the stairs. Make sure that you have a light switch at the top and the bottom of the stairs. Have them installed if you do not have them. Make sure that there are handrails on both sides of the stairs. Fix handrails that are broken or loose. Make sure that handrails are as long as the staircases. Install non-slip stair treads on all stairs in your home if they do not have carpet. Avoid having throw rugs at the top or bottom of stairs, or secure the rugs with carpet tape to prevent them from moving. Choose a carpet design that does not hide the edge of steps on the stairs. Make sure that  carpet is firmly attached to the stairs. Fix any carpet that is loose or worn. What can I do on the outside of my home? Use bright outdoor lighting. Repair the edges of walkways and driveways and fix any cracks. Clear paths of anything that can make you trip, such as tools or rocks. Add color or contrast paint or tape to clearly mark and help you see high doorway thresholds. Trim any bushes or trees on the main path into your home. Check that handrails are securely fastened and in good repair. Both sides of all steps should have handrails. Install guardrails along the edges of any raised decks or porches. Have leaves, snow, and ice cleared regularly. Use sand, salt, or ice melt on walkways during winter months if you live where there is ice and snow. In the garage, clean up any spills right away, including grease or oil spills. What other actions can I take? Review your medicines with your health care provider. Some medicines can make you confused or feel dizzy. This can increase your chance of falling. Wear closed-toe shoes that fit well and support your feet. Wear shoes that have rubber soles and low heels. Use a  cane, walker, scooter, or crutches that help you move around if needed. Talk with your provider about other ways that you can decrease your risk of falls. This may include seeing a physical therapist to learn to do exercises to improve movement and strength. Where to find more information Centers for Disease Control and Prevention, [employer]: TonerPromos.no [employer] on Aging: BaseRingTones.pl National Institute on Aging: BaseRingTones.pl Contact a health care provider if: You are afraid of falling at home. You feel weak, drowsy, or dizzy at home. You fall at home. Get help right away if you: Lose consciousness or have trouble moving after a fall. Have a fall that causes a head injury. These symptoms may be an emergency. Get help right away. Call 911. Do not wait to see if the symptoms will go away. Do not drive yourself to the hospital. This information is not intended to replace advice given to you by your health care provider. Make sure you discuss any questions you have with your health care provider. Document Revised: 01/28/2022 Document Reviewed: 01/28/2022 Elsevier Patient Education  2024 Elsevier Inc.  Managing Pain Without Opioids Opioids are strong medicines used to treat moderate to severe pain. For some people, especially those who have long-term (chronic) pain, opioids may not be the best choice for pain management due to: Side effects like nausea, constipation, and sleepiness. The risk of addiction (opioid use disorder). The longer you take opioids, the greater your risk of addiction. Pain that lasts for more than 3 months is called chronic pain. Managing chronic pain usually requires more than one approach and is often provided by a team of health care providers working together (multidisciplinary approach). Pain management may be done at a pain management center or pain clinic. How to manage pain without the use of opioids Use non-opioid medicines Non-opioid medicines for pain may  include: Over-the-counter or prescription non-steroidal anti-inflammatory drugs (NSAIDs). These may be the first medicines used for pain. They work well for muscle and bone pain, and they reduce swelling. Acetaminophen. This over-the-counter medicine may work well for milder pain but not swelling. Antidepressants. These may be used to treat chronic pain. A certain type of antidepressant (tricyclics) is often used. These medicines are given in lower doses for pain than when used for depression. Anticonvulsants. These are usually  used to treat seizures but may also reduce nerve (neuropathic) pain. Muscle relaxants. These relieve pain caused by sudden muscle tightening (spasms). You may also use a pain medicine that is applied to the skin as a patch, cream, or gel (topical analgesic), such as a numbing medicine. These may cause fewer side effects than medicines taken by mouth. Do certain therapies as directed Some therapies can help with pain management. They include: Physical therapy. You will do exercises to gain strength and flexibility. A physical therapist may teach you exercises to move and stretch parts of your body that are weak, stiff, or painful. You can learn these exercises at physical therapy visits and practice them at home. Physical therapy may also involve: Massage. Heat wraps or applying heat or cold to affected areas. Electrical signals that interrupt pain signals (transcutaneous electrical nerve stimulation, TENS). Weak lasers that reduce pain and swelling (low-level laser therapy). Signals from your body that help you learn to regulate pain (biofeedback). Occupational therapy. This helps you to learn ways to function at home and work with less pain. Recreational therapy. This involves trying new activities or hobbies, such as a physical activity or drawing. Mental health therapy, including: Cognitive behavioral therapy (CBT). This helps you learn coping skills for dealing with  pain. Acceptance and commitment therapy (ACT) to change the way you think and react to pain. Relaxation therapies, including muscle relaxation exercises and mindfulness-based stress reduction. Pain management counseling. This may be individual, family, or group counseling.  Receive medical treatments Medical treatments for pain management include: Nerve block injections. These may include a pain blocker and anti-inflammatory medicines. You may have injections: Near the spine to relieve chronic back or neck pain. Into joints to relieve back or joint pain. Into nerve areas that supply a painful area to relieve body pain. Into muscles (trigger point injections) to relieve some painful muscle conditions. A medical device placed near your spine to help block pain signals and relieve nerve pain or chronic back pain (spinal cord stimulation device). Acupuncture. Follow these instructions at home Medicines Take over-the-counter and prescription medicines only as told by your health care provider. If you are taking pain medicine, ask your health care providers about possible side effects to watch out for. Do not drive or use heavy machinery while taking prescription opioid pain medicine. Lifestyle  Do not use drugs or alcohol to reduce pain. If you drink alcohol, limit how much you have to: 0-1 drink a day for women who are not pregnant. 0-2 drinks a day for men. Know how much alcohol is in a drink. In the [place] one drink equals one 12 oz bottle of beer (355 mL), one 5 oz glass of wine (148 mL), or one 1 oz glass of hard liquor (44 mL). Do not use any products that contain nicotine or tobacco. These products include cigarettes, chewing tobacco, and vaping devices, such as e-cigarettes. If you need help quitting, ask your health care provider. Eat a healthy diet and maintain a healthy weight. Poor diet and excess weight may make pain worse. Eat foods that are high in fiber. These include fresh  fruits and vegetables, whole grains, and beans. Limit foods that are high in fat and processed sugars, such as fried and sweet foods. Exercise regularly. Exercise lowers stress and may help relieve pain. Ask your health care provider what activities and exercises are safe for you. If your health care provider approves, join an exercise class that combines movement and stress reduction. Examples  include yoga and tai chi. Get enough sleep. Lack of sleep may make pain worse. Lower stress as much as possible. Practice stress reduction techniques as told by your therapist. General instructions Work with all your pain management providers to find the treatments that work best for you. You are an important member of your pain management team. There are many things you can do to reduce pain on your own. Consider joining an online or in-person support group for people who have chronic pain. Keep all follow-up visits. This is important. Where to find more information You can find more information about managing pain without opioids from: American Academy of Pain Medicine: painmed.org Institute for Chronic Pain: instituteforchronicpain.org American Chronic Pain Association: theacpa.org Contact a health care provider if: You have side effects from pain medicine. Your pain gets worse or does not get better with treatments or home therapy. You are struggling with anxiety or depression. Summary Many types of pain can be managed without opioids. Chronic pain may respond better to pain management without opioids. Pain is best managed when you and a team of health care providers work together. Pain management without opioids may include non-opioid medicines, medical treatments, physical therapy, mental health therapy, and lifestyle changes. Tell your health care providers if your pain gets worse or is not being managed well enough. This information is not intended to replace advice given to you by your health  care provider. Make sure you discuss any questions you have with your health care provider. Document Revised: 09/06/2020 Document Reviewed: 09/06/2020 Elsevier Patient Education  2024 [employer].

## 2024-02-26 NOTE — Progress Notes (Signed)
 Subjective:   Jenna Sheppard is a 78 y.o. who presents for a Medicare Wellness preventive visit.  As a reminder, Annual Wellness Visits don't include a physical exam, and some assessments may be limited, especially if this visit is performed virtually. We may recommend an in-person follow-up visit with your provider if needed.  Visit Complete: Virtual I connected with  Jenna Sheppard on 02/26/24 by a audio enabled telemedicine application and verified that I am speaking with the correct person using two identifiers.  Patient Location: Home  Provider Location: Home Office  I discussed the limitations of evaluation and management by telemedicine. The patient expressed understanding and agreed to proceed.  Vital Signs: Because this visit was a virtual/telehealth visit, some criteria may be missing or patient reported. Any vitals not documented were not able to be obtained and vitals that have been documented are patient reported.  VideoDeclined- This patient declined Librarian, academic. Therefore the visit was completed with audio only.  Persons Participating in Visit: Patient.  AWV Questionnaire: No: Patient Medicare AWV questionnaire was not completed prior to this visit.  Cardiac Risk Factors include: advanced age (>59men, >21 women);diabetes mellitus;hypertension;dyslipidemia     Objective:    Today's Vitals   02/26/24 1506  Weight: 160 lb (72.6 kg)  Height: 5' 8 (1.727 m)  PainSc: 5    Body mass index is 24.33 kg/m.     02/26/2024    3:21 PM 12/05/2022    9:05 AM 11/28/2021    8:25 AM  Advanced Directives  Does Patient Have a Medical Advance Directive? Yes No Yes  Type of Estate agent of [place];Living will  Healthcare Power of [place];Living will  Does patient want to make changes to medical advance directive? No - Patient declined    Copy of Healthcare Power of Attorney in Chart? No - copy requested  No - copy  requested  Would patient like information on creating a medical advance directive?  No - Patient declined     Current Medications (verified) Outpatient Encounter Medications as of 02/26/2024  Medication Sig   amLODipine  (NORVASC ) 5 MG tablet Take 1 tablet by mouth once daily   calcium  carbonate (OS-CAL - DOSED IN MG OF ELEMENTAL CALCIUM ) 1250 (500 Ca) MG tablet Take 1 tablet by mouth.   cephALEXin (KEFLEX) 250 MG capsule Take 250 mg by mouth daily.   diclofenac  Sodium (VOLTAREN) 1 % GEL Apply topically 4 (four) times daily. PRN   ELIQUIS 5 MG TABS tablet Take 5 mg by mouth 2 (two) times daily.   estradiol  (ESTRACE ) 0.1 MG/GM vaginal cream estradiol  0.01% (0.1 mg/gram) vaginal cream  APPLY AS DIRECTED BY TOPICAL ROUTE EVERY OTHER DAY THREE TIMES A WEEK USING FINGERTIP   glucose blood test strip OneTouch Ultra Test strips  TEST ONCE DAILY   Lancets (ONETOUCH DELICA PLUS LANCET33G) MISC OneTouch Delica Plus Lancet 33 gauge  USE ONE LANCET ONE TIME DAILY FOR CHECKING OF BLOOD GLUCOSE   lisinopril  (ZESTRIL ) 40 MG tablet Take 1 tablet by mouth once daily   metFORMIN  (GLUCOPHAGE ) 500 MG tablet Take 1 tablet by mouth once daily with breakfast   Multiple Vitamins-Minerals (MULTIVITAMIN WITH MINERALS) tablet Take 1 tablet by mouth daily.   naloxone (NARCAN) nasal spray 4 mg/0.1 mL Place 1 spray into the nose once.   oxyCODONE (OXY IR/ROXICODONE) 5 MG immediate release tablet Take 2.5-5 mg by mouth every 4 (four) hours as needed.   Krill Oil 1000 MG CAPS Take by mouth. (  Patient not taking: Reported on 02/26/2024)   No facility-administered encounter medications on file as of 02/26/2024.    Allergies (verified) Molds & smuts, Red dye #40 (allura red), and Sulfa antibiotics   History: Past Medical History:  Diagnosis Date   Diabetes mellitus without complication (HCC)    Hypertension    Osteoarthritis    Prediabetes    Past Surgical History:  Procedure Laterality Date   ABDOMINAL HYSTERECTOMY   01/21/2005   Still has 1 ovary   ABLATION ON ENDOMETRIOSIS  01/08/1990   BREAST BIOPSY Right    benign, 20 years ago   BREAST SURGERY  02/16/1985   2nd surgery - 04/29/1996   CHOLECYSTECTOMY  07/21/2000   DILATION AND CURETTAGE OF UTERUS  02/18/1989   EYE SURGERY  02/09/2010   09/05/20   laser eye  02/09/2010   WRIST ARTHROSCOPY Left 04/01/2017   Family History  Problem Relation Age of Onset   Stroke Mother    Varicose Veins Mother    Stroke Father    Breast cancer Sister    Cancer Sister    Breast cancer Paternal Grandmother 57   Parkinson's disease Brother    Diabetes Brother    Social History   Socioeconomic History   Marital status: Married    Spouse name: Marinell   Number of children: 1   Years of education: Not on file   Highest education level: Bachelor's degree (e.g., BA, AB, BS)  Occupational History   Occupation: retired  Tobacco Use   Smoking status: Never   Smokeless tobacco: Never  Vaping Use   Vaping status: Never Used  Substance and Sexual Activity   Alcohol use: Not Currently    Alcohol/week: 1.0 standard drink of alcohol    Types: 1 Glasses of wine per week    Comment: RARE   Drug use: Never   Sexual activity: Not Currently    Birth control/protection: None  Other Topics Concern   Not on file  Social History Narrative   Not on file   Social Drivers of Health   Financial Resource Strain: Low Risk  (02/26/2024)   Overall Financial Resource Strain (CARDIA)    Difficulty of Paying Living Expenses: Not hard at all  Food Insecurity: No Food Insecurity (02/26/2024)   Hunger Vital Sign    Worried About Running Out of Food in the Last Year: Never true    Ran Out of Food in the Last Year: Never true  Transportation Needs: No Transportation Needs (02/26/2024)   PRAPARE - Administrator, Civil Service (Medical): No    Lack of Transportation (Non-Medical): No  Physical Activity: Insufficiently Active (02/26/2024)   Exercise Vital Sign    Days  of Exercise per Week: 3 days    Minutes of Exercise per Session: 20 min  Stress: No Stress Concern Present (02/26/2024)   [employer] of Occupational Health - Occupational Stress Questionnaire    Feeling of Stress: Only a little  Social Connections: Moderately Isolated (02/26/2024)   Social Connection and Isolation Panel    Frequency of Communication with Friends and Family: More than three times a week    Frequency of Social Gatherings with Friends and Family: Three times a week    Attends Religious Services: Patient declined    Active Member of Clubs or Organizations: No    Attends Banker Meetings: Never    Marital Status: Married    Tobacco Counseling Counseling given: Not Answered    Clinical Intake:  Pre-visit preparation completed: Yes  Pain : 0-10 Pain Score: 5  Pain Type: Chronic pain Pain Location: Back (Stage 4 Pancreatic Cancer) Pain Descriptors / Indicators: Aching     BMI - recorded: 24.33 Nutritional Status: BMI of 19-24  Normal Nutritional Risks: None Diabetes: Yes CBG done?: No Did pt. bring in CBG monitor from home?: No  Lab Results  Component Value Date   HGBA1C 7.0 (H) 07/22/2022   HGBA1C 7.2 (H) 03/19/2022   HGBA1C 6.3 (A) 11/22/2021     How often do you need to have someone help you when you read instructions, pamphlets, or other written materials from your doctor or pharmacy?: 1 - Never  Interpreter Needed?: No  Information entered by :: Vina Ned, CMA   Activities of Daily Living     02/26/2024    3:11 PM 02/24/2024    4:07 PM  In your present state of health, do you have any difficulty performing the following activities:  Hearing? 0 0  Vision? 0 0  Difficulty concentrating or making decisions? 0 0  Walking or climbing stairs? 0 0  Dressing or bathing? 0 0  Doing errands, shopping? 0 0  Preparing Food and eating ? N N  Using the Toilet? N N  In the past six months, have you accidently leaked urine? N N  Do  you have problems with loss of bowel control? N N  Managing your Medications? N N  Managing your Finances? N N  Housekeeping or managing your Housekeeping? N N    Patient Care Team: Justus Leita DEL, MD as PCP - General (Internal Medicine) Jia, Jingquan, MD as Referring Physician (Oncology) Jillyn Tully HERO, MD as Referring Physician (Urology) Kennyth Hummer, MD as Referring Physician (Radiation Oncology)  I have updated your Care Teams any recent Medical Services you may have received from other providers in the past year.     Assessment:   This is a routine wellness examination for Jenna Sheppard.  Hearing/Vision screen Hearing Screening - Comments:: Denies hearing loss  Vision Screening - Comments:: Needs DM eye exam. Included list of eye doctors in [place]   Goals Addressed               This Visit's Progress     Increase physical activity (pt-stated)         Depression Screen     02/26/2024    3:19 PM 01/02/2024   10:14 AM 08/12/2023   10:25 AM 12/05/2022    9:03 AM 11/20/2022   11:04 AM 07/22/2022    8:20 AM 03/19/2022    1:19 PM  PHQ 2/9 Scores  PHQ - 2 Score 0 0 0 0 4 0 0  PHQ- 9 Score 0 0  0 4 0 1    Fall Risk     02/26/2024    3:22 PM 02/24/2024    4:07 PM 01/02/2024   10:14 AM 12/15/2023    3:20 PM 08/12/2023   10:19 AM  Fall Risk   Falls in the past year? 1 1 0 0 0  Number falls in past yr: 0 0 0  0  Injury with Fall? 1 1 0  0  Risk for fall due to : History of fall(s);Impaired balance/gait;Orthopedic patient  No Fall Risks  No Fall Risks  Follow up Falls evaluation completed;Education provided  Falls evaluation completed  Falls evaluation completed    MEDICARE RISK AT HOME:  Medicare Risk at Home Any stairs in or around the home?: Yes If  so, are there any without handrails?: No Home free of loose throw rugs in walkways, pet beds, electrical cords, etc?: Yes Adequate lighting in your home to reduce risk of falls?: Yes Life alert?: No Use of a cane,  walker or w/c?: No Grab bars in the bathroom?: No Shower chair or bench in shower?: No Elevated toilet seat or a handicapped toilet?: No  TIMED UP AND GO:  Was the test performed?  No  Cognitive Function: 6CIT completed        02/26/2024    3:25 PM 12/05/2022    9:07 AM  6CIT Screen  What Year? 0 points 0 points  What month? 0 points 0 points  What time? 0 points 0 points  Count back from 20 0 points 0 points  Months in reverse 0 points 0 points  Repeat phrase 0 points 0 points  Total Score 0 points 0 points    Immunizations Immunization History  Administered Date(s) Administered   Fluad Trivalent(High Dose 65+) 02/28/2017, 02/27/2018   Fluzone Influenza virus vaccine,trivalent (IIV3), split virus 03/29/2015, 03/04/2016, 04/04/2016   Influenza,trivalent, recombinat, inj, PF 02/28/2017, 02/27/2018   Influenza-Unspecified 03/08/2021, 03/04/2022   Moderna Covid-19 Vaccine Bivalent Booster 90yrs & up 10/16/2021   PFIZER Comirnaty(Gray Top)Covid-19 Tri-Sucrose Vaccine 07/10/2019, 07/31/2019, 09/12/2020, 02/23/2021   PFIZER(Purple Top)SARS-COV-2 Vaccination 02/27/2022   Pneumococcal Conjugate-13 04/04/2016   Pneumococcal Polysaccharide-23 06/11/2007, 10/04/2015   Tdap 11/01/2021   Tetanus Immune Globulin 04/04/2016   Zoster Recombinant(Shingrix) 11/19/2016, 01/18/2017    Screening Tests Health Maintenance  Topic Date Due   OPHTHALMOLOGY EXAM  Never done   HEMOGLOBIN A1C  01/20/2023   Colonoscopy  02/25/2023   Diabetic kidney evaluation - Urine ACR  07/23/2023   FOOT EXAM  07/23/2023   DEXA SCAN  09/05/2023   Influenza Vaccine  01/09/2024   COVID-19 Vaccine (7 - 2025-26 season) 02/09/2024   Diabetic kidney evaluation - eGFR measurement  05/08/2024   Medicare Annual Wellness (AWV)  02/25/2025   DTaP/Tdap/Td (2 - Td or Tdap) 11/02/2031   Pneumococcal Vaccine: 50+ Years  Completed   Hepatitis C Screening  Completed   Zoster Vaccines- Shingrix  Completed   HPV VACCINES   Aged Out   Meningococcal B Vaccine  Aged Out    Health Maintenance Items Addressed: Diabetic Foot Exam recommended, See Nurse Notes at the end of this note  Additional Screening:  Vision Screening: Recommended annual ophthalmology exams for early detection of glaucoma and other disorders of the eye. Is the patient up to date with their annual eye exam?  No  Who is the provider or what is the name of the office in which the patient attends annual eye exams? Included list of eye doctors in the area in [place]  Dental Screening: Recommended annual dental exams for proper oral hygiene  Community Resource Referral / Chronic Care Management: CRR required this visit?  No   CCM required this visit?  No   Plan:    I have personally reviewed and noted the following in the patient's chart:   Medical and social history Use of alcohol, tobacco or illicit drugs  Current medications and supplements including opioid prescriptions. Patient is currently taking opioid prescriptions. Information provided to patient regarding non-opioid alternatives. Patient advised to discuss non-opioid treatment plan with their provider. Functional ability and status Nutritional status Physical activity Advanced directives List of other physicians Hospitalizations, surgeries, and ER visits in previous 12 months Vitals Screenings to include cognitive, depression, and falls Referrals and appointments  In addition, I have reviewed and discussed with patient certain preventive protocols, quality metrics, and best practice recommendations. A written personalized care plan for preventive services as well as general preventive health recommendations were provided to patient.   Vina Ned, CMA   02/26/2024   After Visit Summary: (MyChart) Due to this being a telephonic visit, the after visit summary with patients personalized plan was offered to patient via MyChart   Notes:  Needs DM eye exam. Included list of eye  doctors in [place] Needs DM foot exam at next OV on 05/04/24 Will get flu and covid vaccines (pharmacy) Declined DM and Nutrition Education referral Patient to check on when she needs another colonoscopy 3 or 5 yrs.

## 2024-03-08 ENCOUNTER — Other Ambulatory Visit: Payer: Self-pay | Admitting: Internal Medicine

## 2024-03-09 NOTE — Telephone Encounter (Signed)
 Requested medications are due for refill today.  yes  Requested medications are on the active medications list.  yes  Last refill. 12/08/2023 #90 0 rf  Future visit scheduled.   yes  Notes to clinic.  Expired labs    Requested Prescriptions  Pending Prescriptions Disp Refills   metFORMIN  (GLUCOPHAGE ) 500 MG tablet [Pharmacy Med Name: metFORMIN  HCl 500 MG Oral Tablet] 90 tablet 0    Sig: Take 1 tablet by mouth once daily with breakfast     Endocrinology:  Diabetes - Biguanides Failed - 03/09/2024  4:37 PM      Failed - HBA1C is between 0 and 7.9 and within 180 days    Hgb A1c MFr Bld  Date Value Ref Range Status  07/22/2022 7.0 (H) 4.8 - 5.6 % Final    Comment:             Prediabetes: 5.7 - 6.4          Diabetes: >6.4          Glycemic control for adults with diabetes: <7.0          Failed - B12 Level in normal range and within 720 days    No results found for: VITAMINB12       Failed - CBC within normal limits and completed in the last 12 months    WBC  Date Value Ref Range Status  05/09/2023 5.4  Final   RBC  Date Value Ref Range Status  07/22/2022 4.08 3.77 - 5.28 x10E6/uL Final   Hemoglobin  Date Value Ref Range Status  05/09/2023 11.2 (A) 12.0 - 16.0 Final  07/22/2022 12.6 11.1 - 15.9 g/dL Final   HCT  Date Value Ref Range Status  05/09/2023 32 (A) 36 - 46 Final   Hematocrit  Date Value Ref Range Status  07/22/2022 37.6 34.0 - 46.6 % Final   MCHC  Date Value Ref Range Status  07/22/2022 33.5 31.5 - 35.7 g/dL Final   [facility]  Date Value Ref Range Status  07/22/2022 30.9 26.6 - 33.0 pg Final   MCV  Date Value Ref Range Status  07/22/2022 92 79 - 97 fL Final   No results found for: PLTCOUNTKUC, LABPLAT, POCPLA RDW  Date Value Ref Range Status  07/22/2022 12.5 11.7 - 15.4 % Final         Passed - Cr in normal range and within 360 days    Creatinine  Date Value Ref Range Status  05/09/2023 0.7 0.5 - 1.1 Final   Creatinine, Ser  Date  Value Ref Range Status  07/22/2022 0.79 0.57 - 1.00 mg/dL Final         Passed - eGFR in normal range and within 360 days    eGFR  Date Value Ref Range Status  05/09/2023 90  Final  07/22/2022 77 >59 mL/min/1.73 Final         Passed - Valid encounter within last 6 months    Recent Outpatient Visits           2 months ago Dysuria   [facility], Leita DEL, MD   7 months ago Recurrent UTI (urinary tract infection)   [place] at [facility], Leita DEL, MD

## 2024-04-26 ENCOUNTER — Ambulatory Visit (INDEPENDENT_AMBULATORY_CARE_PROVIDER_SITE_OTHER): Admitting: Internal Medicine

## 2024-04-26 ENCOUNTER — Encounter: Payer: Self-pay | Admitting: Internal Medicine

## 2024-04-26 VITALS — BP 112/64 | HR 107 | Ht 68.0 in | Wt 158.0 lb

## 2024-04-26 DIAGNOSIS — Z794 Long term (current) use of insulin: Secondary | ICD-10-CM | POA: Diagnosis not present

## 2024-04-26 DIAGNOSIS — E118 Type 2 diabetes mellitus with unspecified complications: Secondary | ICD-10-CM

## 2024-04-26 LAB — POCT GLYCOSYLATED HEMOGLOBIN (HGB A1C): Hemoglobin A1C: 8.6 % — AB (ref 4.0–5.6)

## 2024-04-26 MED ORDER — PEN NEEDLES 32G X 4 MM MISC: 1.0000 | Freq: Every day | 1 refills | Status: AC

## 2024-04-26 MED ORDER — DEXCOM G7 SENSOR MISC: 2 refills | Status: AC

## 2024-04-26 MED ORDER — LANTUS SOLOSTAR 100 UNIT/ML ~~LOC~~ SOPN: 10.0000 [IU] | PEN_INJECTOR | Freq: Every day | SUBCUTANEOUS | 99 refills | Status: DC

## 2024-04-26 NOTE — Progress Notes (Signed)
 Date:  04/26/2024   Name:  Jenna Sheppard   DOB:  November 29, 1945   MRN:  [id number]   Chief Complaint: No chief complaint on file.  Diabetes She presents for her follow-up diabetic visit. She has type 2 diabetes mellitus. Her disease course has been worsening. Pertinent negatives for hypoglycemia include no headaches, nervousness/anxiousness or tremors. Pertinent negatives for diabetes include no chest pain, no fatigue, no polydipsia and no polyuria. Current diabetic treatment includes oral agent (monotherapy) (MTF).  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  It made her very sick and she could not take MTF for about a week.  She is feeling better and had a lower chemotherapy dose three days ago.  She is nauseated but able to eat some and is taking the MTF.   Her glucose several days ago was 260.  Her last A1C was 7.0.  She is here for diabetes management.  Review of Systems  Constitutional:  Positive for appetite change. Negative for fatigue, fever and unexpected weight change.  HENT:  Negative for tinnitus and trouble swallowing.   Eyes:  Negative for visual disturbance.  Respiratory:  Negative for cough, chest tightness and shortness of breath.   Cardiovascular:  Negative for chest pain, palpitations and leg swelling.  Gastrointestinal:  Negative for abdominal pain.  Endocrine: Negative for polydipsia and polyuria.  Genitourinary:  Negative for dysuria and hematuria.  Musculoskeletal:  Negative for arthralgias.  Neurological:  Negative for tremors, numbness and headaches.  Psychiatric/Behavioral:  Negative for dysphoric mood. The patient is not nervous/anxious.      Lab Results  Component Value Date   NA 142 05/09/2023   K 3.8 05/09/2023   CO2 28 (A) 05/09/2023   GLUCOSE 142 (H) 07/22/2022   BUN 16 05/09/2023   CREATININE 0.7 05/09/2023   CALCIUM  9.6 07/22/2022   EGFR 90 05/09/2023   Lab Results  Component Value Date    CHOL 189 07/22/2022   HDL 80 07/22/2022   LDLCALC 88 07/22/2022   TRIG 124 07/22/2022   CHOLHDL 2.4 07/22/2022   Lab Results  Component Value Date   TSH 2.090 07/22/2022   Lab Results  Component Value Date   HGBA1C 8.6 (A) 04/26/2024   Lab Results  Component Value Date   WBC 5.4 05/09/2023   HGB 11.2 (A) 05/09/2023   HCT 32 (A) 05/09/2023   MCV 92 07/22/2022   PLT 117 (A) 05/09/2023   Lab Results  Component Value Date   ALT 21 07/22/2022   AST 19 07/22/2022   ALKPHOS 68 07/22/2022   BILITOT 0.6 07/22/2022   Lab Results  Component Value Date   VD25OH 44.6 03/19/2022     Patient Active Problem List   Diagnosis Date Noted   Recurrent UTI (urinary tract infection) 08/12/2023   Cervical paraspinal muscle spasm 01/14/2023   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   Neoplasm of uncertain behavior of skin 11/22/2021   Age-related osteoporosis without current pathological fracture 09/04/2021   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   Ovarian failure 07/04/2021   Type II diabetes mellitus with complication (HCC) 07/04/2021   Varicose veins of both lower extremities 07/04/2021   Essential hypertension 10/02/2016   Mixed hyperlipidemia 10/02/2016    Allergies  Allergen Reactions   Molds & Smuts Shortness Of Breath   Red Dye #40 (Allura Red) Rash    Contact only   Sulfa  Antibiotics Nausea Only    Past Surgical History:  Procedure Laterality Date   ABDOMINAL HYSTERECTOMY  01/21/2005   Still has 1 ovary   ABLATION ON ENDOMETRIOSIS  01/08/1990   BREAST BIOPSY Right    benign, 20 years ago   BREAST SURGERY  02/16/1985   2nd surgery - 04/29/1996   CHOLECYSTECTOMY  07/21/2000   DILATION AND CURETTAGE OF UTERUS  02/18/1989   EYE SURGERY  02/09/2010   09/05/20   laser eye  02/09/2010   WRIST ARTHROSCOPY Left 04/01/2017    Social History   Tobacco Use   Smoking status: Never   Smokeless tobacco: Never  Vaping  Use   Vaping status: Never Used  Substance Use Topics   Alcohol use: Not Currently    Alcohol/week: 1.0 standard drink of alcohol    Types: 1 Glasses of wine per week    Comment: RARE   Drug use: Never     Medication list has been reviewed and updated.  Current Meds  Medication Sig   amLODipine  (NORVASC ) 5 MG tablet Take 1 tablet by mouth once daily   calcium  carbonate (OS-CAL - DOSED IN MG OF ELEMENTAL CALCIUM ) 1250 (500 Ca) MG tablet Take 1 tablet by mouth.   cephALEXin (KEFLEX) 250 MG capsule Take 250 mg by mouth daily.   Continuous Glucose Sensor (DEXCOM G7 SENSOR) MISC Use device every 10 days to check BS   diclofenac  Sodium (VOLTAREN) 1 % GEL Apply topically 4 (four) times daily. PRN   ELIQUIS 5 MG TABS tablet Take 5 mg by mouth 2 (two) times daily.   estradiol  (ESTRACE ) 0.1 MG/GM vaginal cream estradiol  0.01% (0.1 mg/gram) vaginal cream  APPLY AS DIRECTED BY TOPICAL ROUTE EVERY OTHER DAY THREE TIMES A WEEK USING FINGERTIP   glucose blood test strip OneTouch Ultra Test strips  TEST ONCE DAILY   insulin glargine (LANTUS SOLOSTAR) 100 UNIT/ML Solostar Pen Inject 10 Units into the skin daily.   Insulin Pen Needle (PEN NEEDLES) 32G X 4 MM MISC 1 each by Does not apply route daily at 6 (six) AM.   Lancets (ONETOUCH DELICA PLUS LANCET33G) MISC OneTouch Delica Plus Lancet 33 gauge  USE ONE LANCET ONE TIME DAILY FOR CHECKING OF BLOOD GLUCOSE   lisinopril  (ZESTRIL ) 40 MG tablet Take 1 tablet by mouth once daily   metFORMIN  (GLUCOPHAGE ) 500 MG tablet Take 1 tablet by mouth once daily with breakfast   Multiple Vitamins-Minerals (MULTIVITAMIN WITH MINERALS) tablet Take 1 tablet by mouth daily.   naloxone (NARCAN) nasal spray 4 mg/0.1 mL Place 1 spray into the nose once.   OLANZapine (ZYPREXA) 5 MG tablet Take 5 mg by mouth at bedtime.   ondansetron (ZOFRAN-ODT) 8 MG disintegrating tablet Take 8 mg by mouth every 8 (eight) hours as needed for nausea or vomiting.   oxyCODONE (OXY  IR/ROXICODONE) 5 MG immediate release tablet Take 2.5-5 mg by mouth every 4 (four) hours as needed.   prochlorperazine (COMPAZINE) 10 MG tablet Take 10 mg by mouth every 6 (six) hours as needed for nausea.       04/26/2024    2:53 PM 01/02/2024   10:15 AM 08/12/2023   10:26 AM 08/12/2023   10:25 AM  GAD 7 : Generalized Anxiety Score  Nervous, Anxious, on Edge 0 0 1 0  Control/stop worrying 0 0 1 0  Worry too much - different things 0 0  0  Trouble relaxing 0 0  0  Restless 0 0  0  Easily annoyed  or irritable 0 0  0  Afraid - awful might happen 0 0  0  Total GAD 7 Score 0 0  0  Anxiety Difficulty Not difficult at all Not difficult at all  Not difficult at all       04/26/2024    2:53 PM 02/26/2024    3:19 PM 01/02/2024   10:14 AM  Depression screen PHQ 2/9  Decreased Interest 0 0 0  Down, Depressed, Hopeless 0 0 0  PHQ - 2 Score 0 0 0  Altered sleeping 0 0 0  Tired, decreased energy 0 0 0  Change in appetite 0 0 0  Feeling bad or failure about yourself  0 0 0  Trouble concentrating 0 0 0  Moving slowly or fidgety/restless 0 0 0  Suicidal thoughts 0 0 0  PHQ-9 Score 0 0  0   Difficult doing work/chores Not difficult at all Not difficult at all Not difficult at all     Data saved with a previous flowsheet row definition    BP Readings from Last 3 Encounters:  04/26/24 112/64  01/02/24 122/68  08/12/23 126/78    Physical Exam Vitals and nursing note reviewed.  Constitutional:      General: She is not in acute distress.    Appearance: She is well-developed. She is ill-appearing.  HENT:     Head: Normocephalic and atraumatic.  Cardiovascular:     Rate and Rhythm: Normal rate and regular rhythm.  Pulmonary:     Effort: Pulmonary effort is normal. No respiratory distress.     Breath sounds: No wheezing or rhonchi.  Musculoskeletal:     Right lower leg: No edema.     Left lower leg: No edema.  Skin:    General: Skin is warm and dry.     Findings: No rash.   Neurological:     Mental Status: She is alert and oriented to person, place, and time.  Psychiatric:        Mood and Affect: Mood normal.        Behavior: Behavior normal.     Wt Readings from Last 3 Encounters:  04/26/24 158 lb (71.7 kg)  02/26/24 160 lb (72.6 kg)  01/02/24 162 lb (73.5 kg)    BP 112/64   Pulse (!) 107   Ht 5' 8 (1.727 m)   Wt 158 lb (71.7 kg)   SpO2 98%   BMI 24.02 kg/m   Assessment and Plan:  Problem List Items Addressed This Visit       Unprioritized   Type II diabetes mellitus with complication (HCC) - Primary   Currently medications are MTF.  No hypoglycemic episodes noted. Blood sugars have been very high lately due to chemotherapy and n/v. Last visit medical regimen changes were none. Lab Results  Component Value Date   HGBA1C 7.0 (H) 07/22/2022  A1C today =  8.6. Recommend stopping MTF and starting insulin injections which can be easily titrated or held according to her intake. Will also start DEXCOM for ease of monitoring and adjusting dose.       Relevant Medications   insulin glargine (LANTUS SOLOSTAR) 100 UNIT/ML Solostar Pen   Insulin Pen Needle (PEN NEEDLES) 32G X 4 MM MISC   Continuous Glucose Sensor (DEXCOM G7 SENSOR) MISC   Other Relevant Orders   POCT glycosylated hemoglobin (Hb A1C) (Completed)   Microalbumin / creatinine urine ratio   Other Visit Diagnoses       Long-term insulin use (HCC)  Relevant Medications   Continuous Glucose Sensor (DEXCOM G7 SENSOR) MISC       No follow-ups on file.    Leita HILARIO Adie, MD [facility] [place]

## 2024-04-26 NOTE — Patient Instructions (Signed)
 Give 10 units of insulin every AM.  Hold the dose or give only 5 units if not eating.

## 2024-04-26 NOTE — Assessment & Plan Note (Addendum)
 Currently medications are MTF.  No hypoglycemic episodes noted. Blood sugars have been very high lately due to chemotherapy and n/v. Last visit medical regimen changes were none. Lab Results  Component Value Date   HGBA1C 7.0 (H) 07/22/2022  A1C today =  8.6. Recommend stopping MTF and starting insulin injections which can be easily titrated or held according to her intake. Will also start DEXCOM for ease of monitoring and adjusting dose.

## 2024-04-27 ENCOUNTER — Ambulatory Visit: Payer: Self-pay | Admitting: Internal Medicine

## 2024-04-27 LAB — MICROALBUMIN / CREATININE URINE RATIO
Creatinine, Urine: 139 mg/dL
Microalb/Creat Ratio: 21 mg/g{creat} (ref 0–29)
Microalbumin, Urine: 29.7 ug/mL

## 2024-05-04 ENCOUNTER — Ambulatory Visit: Admitting: Internal Medicine

## 2024-05-05 ENCOUNTER — Telehealth: Payer: Self-pay | Admitting: Internal Medicine

## 2024-05-05 ENCOUNTER — Other Ambulatory Visit (HOSPITAL_COMMUNITY): Payer: Self-pay

## 2024-05-05 ENCOUNTER — Ambulatory Visit: Admitting: Internal Medicine

## 2024-05-05 ENCOUNTER — Other Ambulatory Visit: Payer: Self-pay | Admitting: Internal Medicine

## 2024-05-05 DIAGNOSIS — I10 Essential (primary) hypertension: Secondary | ICD-10-CM

## 2024-05-05 NOTE — Telephone Encounter (Signed)
 Recently started patient on insulin for diabetes.  Also prescribed DEXCOM.  Please check on PA for this - patient states that she has not received it from the pharmacy.

## 2024-05-10 ENCOUNTER — Other Ambulatory Visit (HOSPITAL_COMMUNITY): Payer: Self-pay

## 2024-05-10 ENCOUNTER — Telehealth: Payer: Self-pay | Admitting: Pharmacy Technician

## 2024-05-10 NOTE — Telephone Encounter (Signed)
 PA request has been Received. New Encounter has been or will be created for follow up. For additional info see Pharmacy Prior Auth telephone encounter from 05/10/24.

## 2024-05-10 NOTE — Telephone Encounter (Signed)
 Pharmacy Patient Advocate Encounter   Received notification from Pt Calls Messages that prior authorization for Dexcom G7 Sensor is required/requested.   Insurance verification completed.   The patient is insured through [employer].   Per test claim: Not Covered Under Part D, Pharmacy need to bill claim to Medicare Part B per plan.

## 2024-05-10 NOTE — Telephone Encounter (Signed)
 Requested Prescriptions  Pending Prescriptions Disp Refills   amLODipine  (NORVASC ) 5 MG tablet [Pharmacy Med Name: amLODIPine  Besylate 5 MG Oral Tablet] 90 tablet 0    Sig: Take 1 tablet by mouth once daily     Cardiovascular: Calcium  Channel Blockers 2 Passed - 05/10/2024  3:24 PM      Passed - Last BP in normal range    BP Readings from Last 1 Encounters:  04/26/24 112/64         Passed - Last Heart Rate in normal range    Pulse Readings from Last 1 Encounters:  04/26/24 (!) 107         Passed - Valid encounter within last 6 months    Recent Outpatient Visits           2 weeks ago Type II diabetes mellitus with complication [facility])   [place] [facility] at [facility], Leita DEL, MD   4 months ago Dysuria   [facility] at [facility], Leita DEL, MD   9 months ago Recurrent UTI (urinary tract infection)   [facility] at [facility], Leita DEL, MD

## 2024-05-13 NOTE — Telephone Encounter (Signed)
 Please review.  KP

## 2024-05-14 ENCOUNTER — Other Ambulatory Visit: Payer: Self-pay | Admitting: Internal Medicine

## 2024-05-14 DIAGNOSIS — I10 Essential (primary) hypertension: Secondary | ICD-10-CM

## 2024-05-14 NOTE — Telephone Encounter (Signed)
 Called pt let her husband know.  KP

## 2024-05-17 ENCOUNTER — Telehealth: Payer: Self-pay

## 2024-05-17 NOTE — Telephone Encounter (Signed)
 Copied from CRM [id number]. Topic: General - Other >> May 17, 2024  1:28 PM Fonda T wrote: Reason for CRM: Received call from [facility] with [employer], [phone].  Calling to verify if fax was received that was faxed to office on behalf of pt on 05/13/24, for diabetes supplies for Dexcom.   Requesting a follow up call to above number to verify,  Aware of same day call back.

## 2024-05-17 NOTE — Telephone Encounter (Signed)
 Requested Prescriptions  Pending Prescriptions Disp Refills   lisinopril  (ZESTRIL ) 40 MG tablet [Pharmacy Med Name: Lisinopril  40 MG Oral Tablet] 90 tablet 0    Sig: Take 1 tablet by mouth once daily     Cardiovascular:  ACE Inhibitors Failed - 05/17/2024  2:33 PM      Failed - Cr in normal range and within 180 days    Creatinine  Date Value Ref Range Status  05/09/2023 0.7 0.5 - 1.1 Final   Creatinine, Ser  Date Value Ref Range Status  07/22/2022 0.79 0.57 - 1.00 mg/dL Final         Failed - K in normal range and within 180 days    Potassium  Date Value Ref Range Status  05/09/2023 3.8 3.5 - 5.1 mEq/L Final         Passed - Patient is not pregnant      Passed - Last BP in normal range    BP Readings from Last 1 Encounters:  04/26/24 112/64         Passed - Valid encounter within last 6 months    Recent Outpatient Visits           3 weeks ago Type II diabetes mellitus with complication [facility])   [place] [facility] at [facility], Leita DEL, MD   4 months ago Dysuria   [facility] Medicine at [facility], Leita DEL, MD   9 months ago Recurrent UTI (urinary tract infection)   [facility] Medicine at [facility], Leita DEL, MD

## 2024-05-17 NOTE — Telephone Encounter (Signed)
 Called patient and requested for her to call back to confirm if this is something she is requesting.  - CMcAdoo

## 2024-05-18 ENCOUNTER — Telehealth: Payer: Self-pay

## 2024-05-18 NOTE — Telephone Encounter (Signed)
 Copied from CRM [id number]. Topic: General - Other >> May 18, 2024  9:28 AM Frederich PARAS wrote: Reason for CRM: pt called to speak to [place], [place]. Pt says she gives chassidy permission to speak to the company. Pt says she will call or you can call her on [phone]

## 2024-05-18 NOTE — Telephone Encounter (Signed)
 Called [employer] company back and informed them that we have not received the FAX for the Dexcom yet. They said they will refax this to our clinic. Confirmed they had correct FAX number.  - Nyisha Clippard M.

## 2024-05-24 ENCOUNTER — Ambulatory Visit (INDEPENDENT_AMBULATORY_CARE_PROVIDER_SITE_OTHER): Admitting: Internal Medicine

## 2024-05-24 ENCOUNTER — Encounter: Payer: Self-pay | Admitting: Internal Medicine

## 2024-05-24 VITALS — BP 118/62 | HR 91 | Ht 68.0 in | Wt 161.0 lb

## 2024-05-24 DIAGNOSIS — E118 Type 2 diabetes mellitus with unspecified complications: Secondary | ICD-10-CM | POA: Diagnosis not present

## 2024-05-24 DIAGNOSIS — Z794 Long term (current) use of insulin: Secondary | ICD-10-CM

## 2024-05-24 LAB — BASIC METABOLIC PANEL WITH GFR
BUN: 14 (ref 4–21)
CO2: 26 — AB (ref 13–22)
Chloride: 99 (ref 99–108)
Creatinine: 0.8 (ref 0.5–1.1)
Glucose: 230
Potassium: 4.3 meq/L (ref 3.5–5.1)
Sodium: 138 (ref 137–147)

## 2024-05-24 LAB — COMPREHENSIVE METABOLIC PANEL WITH GFR
Calcium: 8.2 — AB (ref 8.7–10.7)
eGFR: 80

## 2024-05-24 MED ORDER — LANTUS SOLOSTAR 100 UNIT/ML ~~LOC~~ SOPN: 20.0000 [IU] | PEN_INJECTOR | Freq: Every day | SUBCUTANEOUS | 1 refills | Status: AC

## 2024-05-24 NOTE — Progress Notes (Signed)
 Date:  05/24/2024   Name:  Jenna Sheppard   DOB:  1945-08-29   MRN:  [id number]   Chief Complaint: Diabetes  Diabetes She presents for her follow-up diabetic visit. She has type 2 diabetes mellitus. Her disease course has been improving. Pertinent negatives for hypoglycemia include no dizziness or headaches. Pertinent negatives for diabetes include no chest pain. Current diabetic treatments: MTF stopped,  Insulin started. She is compliant with treatment all of the time.    Review of Systems  Constitutional:  Negative for chills and fever.  Eyes:  Negative for visual disturbance.  Respiratory:  Negative for chest tightness and shortness of breath.   Cardiovascular:  Negative for chest pain.  Neurological:  Negative for dizziness and headaches.     Lab Results  Component Value Date   NA 138 05/24/2024   K 4.3 05/24/2024   CO2 26 (A) 05/24/2024   GLUCOSE 142 (H) 07/22/2022   BUN 14 05/24/2024   CREATININE 0.8 05/24/2024   CALCIUM  8.2 (A) 05/24/2024   EGFR 80 05/24/2024   Lab Results  Component Value Date   CHOL 189 07/22/2022   HDL 80 07/22/2022   LDLCALC 88 07/22/2022   TRIG 124 07/22/2022   CHOLHDL 2.4 07/22/2022   Lab Results  Component Value Date   TSH 2.090 07/22/2022   Lab Results  Component Value Date   HGBA1C 8.6 (A) 04/26/2024   Lab Results  Component Value Date   WBC 5.4 05/09/2023   HGB 11.2 (A) 05/09/2023   HCT 32 (A) 05/09/2023   MCV 92 07/22/2022   PLT 117 (A) 05/09/2023   Lab Results  Component Value Date   ALT 21 07/22/2022   AST 19 07/22/2022   ALKPHOS 68 07/22/2022   BILITOT 0.6 07/22/2022   Lab Results  Component Value Date   VD25OH 44.6 03/19/2022     Patient Active Problem List   Diagnosis Date Noted   Recurrent UTI (urinary tract infection) 08/12/2023   Cervical paraspinal muscle spasm 01/14/2023   Malignant neoplasm of head of pancreas (HCC) 08/21/2022   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   Neoplasm of uncertain behavior  of skin 11/22/2021   Age-related osteoporosis without current pathological fracture 09/04/2021   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   Ovarian failure 07/04/2021   Type II diabetes mellitus with complication (HCC) 07/04/2021   Varicose veins of both lower extremities 07/04/2021   Essential hypertension 10/02/2016   Mixed hyperlipidemia 10/02/2016    Allergies[1]  Past Surgical History:  Procedure Laterality Date   ABDOMINAL HYSTERECTOMY  01/21/2005   Still has 1 ovary   ABLATION ON ENDOMETRIOSIS  01/08/1990   BREAST BIOPSY Right    benign, 20 years ago   BREAST SURGERY  02/16/1985   2nd surgery - 04/29/1996   CHOLECYSTECTOMY  07/21/2000   DILATION AND CURETTAGE OF UTERUS  02/18/1989   EYE SURGERY  02/09/2010   09/05/20   laser eye  02/09/2010   WRIST ARTHROSCOPY Left 04/01/2017    Social History[2]   Medication list has been reviewed and updated.  Active Medications[3]     04/26/2024    2:53 PM 01/02/2024   10:15 AM 08/12/2023   10:26 AM 08/12/2023   10:25 AM  GAD 7 : Generalized Anxiety Score  Nervous, Anxious, on Edge 0 0 1 0  Control/stop worrying 0 0 1 0  Worry too much - different things 0 0  0  Trouble relaxing 0 0  0  Restless  0 0  0  Easily annoyed or irritable 0 0  0  Afraid - awful might happen 0 0  0  Total GAD 7 Score 0 0  0  Anxiety Difficulty Not difficult at all Not difficult at all  Not difficult at all       04/26/2024    2:53 PM 02/26/2024    3:19 PM 01/02/2024   10:14 AM  Depression screen PHQ 2/9  Decreased Interest 0 0 0  Down, Depressed, Hopeless 0 0 0  PHQ - 2 Score 0 0 0  Altered sleeping 0 0 0  Tired, decreased energy 0 0 0  Change in appetite 0 0 0  Feeling bad or failure about yourself  0 0 0  Trouble concentrating 0 0 0  Moving slowly or fidgety/restless 0 0 0  Suicidal thoughts 0 0 0  PHQ-9 Score 0 0  0   Difficult doing work/chores Not difficult at all Not difficult at all Not difficult at all     Data saved  with a previous flowsheet row definition    BP Readings from Last 3 Encounters:  05/24/24 118/62  04/26/24 112/64  01/02/24 122/68    Physical Exam Vitals and nursing note reviewed.  Constitutional:      General: She is not in acute distress.    Appearance: Normal appearance. She is well-developed.  HENT:     Head: Normocephalic and atraumatic.  Cardiovascular:     Rate and Rhythm: Normal rate and regular rhythm.  Pulmonary:     Effort: Pulmonary effort is normal. No respiratory distress.     Breath sounds: No wheezing or rhonchi.  Musculoskeletal:     Right lower leg: No edema.     Left lower leg: No edema.  Skin:    General: Skin is warm and dry.     Findings: No rash.  Neurological:     Mental Status: She is alert and oriented to person, place, and time.  Psychiatric:        Mood and Affect: Mood normal.        Behavior: Behavior normal.     Wt Readings from Last 3 Encounters:  05/24/24 161 lb (73 kg)  04/26/24 158 lb (71.7 kg)  02/26/24 160 lb (72.6 kg)    BP 118/62   Pulse 91   Ht 5' 8 (1.727 m)   Wt 161 lb (73 kg)   SpO2 99%   BMI 24.48 kg/m   Assessment and Plan:  Problem List Items Addressed This Visit       Unprioritized   Type II diabetes mellitus with complication (HCC) - Primary   BS slowly improving on insulin. MTF stopped last visit. Dose of insulin now 15 units. Will increase the dose to 20 units. Ask oncology to add A1C to labs due 06/15/24.      Relevant Medications   insulin glargine  (LANTUS  SOLOSTAR) 100 UNIT/ML Solostar Pen   Other Visit Diagnoses       Long-term insulin use (HCC)           No follow-ups on file.    Leita HILARIO Adie, MD [facility] [place]           [1]  Allergies Allergen Reactions   Molds & Smuts Shortness Of Breath   Red Dye #40 (Allura Red) Rash    Contact only   Sulfa Antibiotics Nausea Only  [2]  Social History Tobacco Use   Smoking  status: Never    Smokeless tobacco: Never  Vaping Use   Vaping status: Never Used  Substance Use Topics   Alcohol use: Not Currently    Alcohol/week: 1.0 standard drink of alcohol    Types: 1 Glasses of wine per week    Comment: RARE   Drug use: Never  [3]  Current Meds  Medication Sig   amLODipine  (NORVASC ) 5 MG tablet Take 1 tablet by mouth once daily   calcium  carbonate (OS-CAL - DOSED IN MG OF ELEMENTAL CALCIUM ) 1250 (500 Ca) MG tablet Take 1 tablet by mouth.   cephALEXin (KEFLEX) 250 MG capsule Take 250 mg by mouth daily.   Continuous Glucose Sensor (DEXCOM G7 SENSOR) MISC Use device every 10 days to check BS   diclofenac  Sodium (VOLTAREN) 1 % GEL Apply topically 4 (four) times daily. PRN   diphenoxylate-atropine (LOMOTIL) 2.5-0.025 MG tablet Take 1 tablet by mouth 4 (four) times daily as needed.   ELIQUIS 5 MG TABS tablet Take 5 mg by mouth 2 (two) times daily.   estradiol  (ESTRACE ) 0.1 MG/GM vaginal cream estradiol  0.01% (0.1 mg/gram) vaginal cream  APPLY AS DIRECTED BY TOPICAL ROUTE EVERY OTHER DAY THREE TIMES A WEEK USING FINGERTIP   glucose blood test strip OneTouch Ultra Test strips  TEST ONCE DAILY   Insulin Pen Needle (PEN NEEDLES) 32G X 4 MM MISC 1 each by Does not apply route daily at 6 (six) AM.   Lancets (ONETOUCH DELICA PLUS LANCET33G) MISC OneTouch Delica Plus Lancet 33 gauge  USE ONE LANCET ONE TIME DAILY FOR CHECKING OF BLOOD GLUCOSE   lisinopril  (ZESTRIL ) 40 MG tablet Take 1 tablet by mouth once daily   MAGNESIUM-OXIDE 400 (240 Mg) MG tablet Take 1 tablet by mouth daily.   Multiple Vitamins-Minerals (MULTIVITAMIN WITH MINERALS) tablet Take 1 tablet by mouth daily.   naloxone (NARCAN) nasal spray 4 mg/0.1 mL Place 1 spray into the nose once.   OLANZapine (ZYPREXA) 10 MG tablet Take 10 mg by mouth at bedtime.   ondansetron (ZOFRAN) 8 MG tablet Take 8 mg by mouth.   ondansetron (ZOFRAN-ODT) 8 MG disintegrating tablet Take 8 mg by mouth every 8 (eight) hours as needed for nausea or  vomiting.   oxyCODONE (OXY IR/ROXICODONE) 5 MG immediate release tablet Take 2.5-5 mg by mouth every 4 (four) hours as needed.   prochlorperazine (COMPAZINE) 10 MG tablet Take 10 mg by mouth every 6 (six) hours as needed for nausea.   [DISCONTINUED] insulin glargine  (LANTUS  SOLOSTAR) 100 UNIT/ML Solostar Pen Inject 10 Units into the skin daily.

## 2024-05-24 NOTE — Assessment & Plan Note (Addendum)
 BS slowly improving on insulin. MTF stopped last visit. Dose of insulin now 15 units. Will increase the dose to 20 units. Ask oncology to add A1C to labs due 06/15/24.

## 2024-06-11 ENCOUNTER — Other Ambulatory Visit: Payer: Self-pay | Admitting: Internal Medicine

## 2024-06-11 NOTE — Telephone Encounter (Signed)
 Requested Prescriptions  Refused Prescriptions Disp Refills   metFORMIN  (GLUCOPHAGE ) 500 MG tablet [Pharmacy Med Name: metFORMIN  HCl 500 MG Oral Tablet] 90 tablet 0    Sig: Take 1 tablet by mouth once daily with breakfast     Endocrinology:  Diabetes - Biguanides Failed - 06/11/2024  2:14 PM      Failed - HBA1C is between 0 and 7.9 and within 180 days    Hemoglobin A1C  Date Value Ref Range Status  04/26/2024 8.6 (A) 4.0 - 5.6 % Final   Hgb A1c MFr Bld  Date Value Ref Range Status  07/22/2022 7.0 (H) 4.8 - 5.6 % Final    Comment:             Prediabetes: 5.7 - 6.4          Diabetes: >6.4          Glycemic control for adults with diabetes: <7.0          Failed - B12 Level in normal range and within 720 days    No results found for: VITAMINB12       Failed - CBC within normal limits and completed in the last 12 months    WBC  Date Value Ref Range Status  05/09/2023 5.4  Final   RBC  Date Value Ref Range Status  07/22/2022 4.08 3.77 - 5.28 x10E6/uL Final   Hemoglobin  Date Value Ref Range Status  05/09/2023 11.2 (A) 12.0 - 16.0 Final  07/22/2022 12.6 11.1 - 15.9 g/dL Final   HCT  Date Value Ref Range Status  05/09/2023 32 (A) 36 - 46 Final   Hematocrit  Date Value Ref Range Status  07/22/2022 37.6 34.0 - 46.6 % Final   MCHC  Date Value Ref Range Status  07/22/2022 33.5 31.5 - 35.7 g/dL Final   [facility]  Date Value Ref Range Status  07/22/2022 30.9 26.6 - 33.0 pg Final   MCV  Date Value Ref Range Status  07/22/2022 92 79 - 97 fL Final   No results found for: PLTCOUNTKUC, LABPLAT, POCPLA RDW  Date Value Ref Range Status  07/22/2022 12.5 11.7 - 15.4 % Final         Passed - Cr in normal range and within 360 days    Creatinine  Date Value Ref Range Status  05/24/2024 0.8 0.5 - 1.1 Final   Creatinine, Ser  Date Value Ref Range Status  07/22/2022 0.79 0.57 - 1.00 mg/dL Final         Passed - eGFR in normal range and within 360 days    eGFR  Date  Value Ref Range Status  05/24/2024 80  Final  07/22/2022 77 >59 mL/min/1.73 Final         Passed - Valid encounter within last 6 months    Recent Outpatient Visits           2 weeks ago Type II diabetes mellitus with complication (HCC)   [facility] [facility] at [facility], Leita DEL, MD   1 month ago Type II diabetes mellitus with complication [facility])   [place] [facility] at [facility], Leita DEL, MD   5 months ago Dysuria   [facility] at [facility], Leita DEL, MD   10 months ago Recurrent UTI (urinary tract infection)   [place] [facility] at [facility], Leita DEL, MD

## 2024-08-24 ENCOUNTER — Encounter: Admitting: Student

## 2025-03-09 ENCOUNTER — Ambulatory Visit

## 2025-03-10 ENCOUNTER — Ambulatory Visit
